# Patient Record
Sex: Female | Born: 1970 | Race: Black or African American | Hispanic: No | Marital: Married | Smoking: Current every day smoker
Health system: Southern US, Community
[De-identification: ages and names within clinical notes are randomized; demographics above are authoritative.]

## PROBLEM LIST (undated history)

## (undated) DIAGNOSIS — G35 Multiple sclerosis: Secondary | ICD-10-CM

## (undated) DIAGNOSIS — G35D Multiple sclerosis, unspecified: Secondary | ICD-10-CM

## (undated) HISTORY — PX: ABDOMINAL HYSTERECTOMY: SHX81

## (undated) HISTORY — PX: BREAST SURGERY: SHX581

## (undated) HISTORY — PX: BACK SURGERY: SHX140

---

## 2013-07-30 DIAGNOSIS — I10 Essential (primary) hypertension: Secondary | ICD-10-CM

## 2013-07-30 HISTORY — DX: Essential (primary) hypertension: I10

## 2013-12-04 DIAGNOSIS — F319 Bipolar disorder, unspecified: Secondary | ICD-10-CM

## 2013-12-04 HISTORY — DX: Bipolar disorder, unspecified: F31.9

## 2013-12-25 DIAGNOSIS — N951 Menopausal and female climacteric states: Secondary | ICD-10-CM

## 2013-12-25 HISTORY — DX: Menopausal and female climacteric states: N95.1

## 2014-08-26 DIAGNOSIS — R748 Abnormal levels of other serum enzymes: Secondary | ICD-10-CM

## 2014-08-26 HISTORY — DX: Abnormal levels of other serum enzymes: R74.8

## 2014-10-29 DIAGNOSIS — R9389 Abnormal findings on diagnostic imaging of other specified body structures: Secondary | ICD-10-CM | POA: Insufficient documentation

## 2014-10-29 HISTORY — DX: Abnormal findings on diagnostic imaging of other specified body structures: R93.89

## 2014-12-10 DIAGNOSIS — R531 Weakness: Secondary | ICD-10-CM | POA: Insufficient documentation

## 2014-12-10 HISTORY — DX: Weakness: R53.1

## 2014-12-19 DIAGNOSIS — G379 Demyelinating disease of central nervous system, unspecified: Secondary | ICD-10-CM

## 2014-12-19 DIAGNOSIS — N3941 Urge incontinence: Secondary | ICD-10-CM

## 2014-12-19 HISTORY — DX: Urge incontinence: N39.41

## 2014-12-19 HISTORY — DX: Demyelinating disease of central nervous system, unspecified: G37.9

## 2015-01-11 DIAGNOSIS — G35A Relapsing-remitting multiple sclerosis: Secondary | ICD-10-CM

## 2015-01-11 DIAGNOSIS — G35 Multiple sclerosis: Secondary | ICD-10-CM

## 2015-01-11 HISTORY — DX: Multiple sclerosis: G35

## 2015-01-11 HISTORY — DX: Relapsing-remitting multiple sclerosis: G35.A

## 2015-03-05 DIAGNOSIS — H524 Presbyopia: Secondary | ICD-10-CM

## 2015-03-05 HISTORY — DX: Presbyopia: H52.4

## 2018-07-07 DIAGNOSIS — R7303 Prediabetes: Secondary | ICD-10-CM

## 2018-07-07 HISTORY — DX: Prediabetes: R73.03

## 2018-10-07 ENCOUNTER — Other Ambulatory Visit (HOSPITAL_BASED_OUTPATIENT_CLINIC_OR_DEPARTMENT_OTHER): Payer: Self-pay | Admitting: Family Medicine

## 2018-10-07 DIAGNOSIS — M79661 Pain in right lower leg: Secondary | ICD-10-CM

## 2018-10-09 ENCOUNTER — Ambulatory Visit (HOSPITAL_BASED_OUTPATIENT_CLINIC_OR_DEPARTMENT_OTHER)
Admission: RE | Admit: 2018-10-09 | Discharge: 2018-10-09 | Disposition: A | Discharge status: Home / Self Care | Payer: Medicare Other | Source: Ambulatory Visit | Attending: Family Medicine | Admitting: Family Medicine

## 2018-10-09 DIAGNOSIS — M7121 Synovial cyst of popliteal space [Baker], right knee: Secondary | ICD-10-CM | POA: Insufficient documentation

## 2018-10-09 DIAGNOSIS — M79661 Pain in right lower leg: Secondary | ICD-10-CM | POA: Diagnosis present

## 2018-12-19 ENCOUNTER — Other Ambulatory Visit: Payer: Self-pay | Admitting: Obstetrics and Gynecology

## 2018-12-19 DIAGNOSIS — Z1231 Encounter for screening mammogram for malignant neoplasm of breast: Secondary | ICD-10-CM

## 2019-01-03 MED ORDER — GENERIC EXTERNAL MEDICATION
Status: DC
Start: 2019-01-04 — End: 2019-01-03

## 2019-01-03 MED ORDER — GENERIC EXTERNAL MEDICATION
150.00 | Status: DC
Start: 2019-01-04 — End: 2019-01-03

## 2019-02-06 DIAGNOSIS — G4733 Obstructive sleep apnea (adult) (pediatric): Secondary | ICD-10-CM

## 2019-02-06 HISTORY — DX: Obstructive sleep apnea (adult) (pediatric): G47.33

## 2019-03-31 DIAGNOSIS — K219 Gastro-esophageal reflux disease without esophagitis: Secondary | ICD-10-CM

## 2019-03-31 DIAGNOSIS — G894 Chronic pain syndrome: Secondary | ICD-10-CM

## 2019-03-31 DIAGNOSIS — F339 Major depressive disorder, recurrent, unspecified: Secondary | ICD-10-CM

## 2019-03-31 HISTORY — DX: Chronic pain syndrome: G89.4

## 2019-03-31 HISTORY — DX: Morbid (severe) obesity due to excess calories: E66.01

## 2019-03-31 HISTORY — DX: Gastro-esophageal reflux disease without esophagitis: K21.9

## 2019-03-31 HISTORY — DX: Major depressive disorder, recurrent, unspecified: F33.9

## 2019-04-06 IMAGING — US US EXTREM LOW VENOUS*R*
1 series · 14 of 24 positions shown · non-contrast
Comparison: None.

CLINICAL DATA: Right calf pain and swelling

EXAM:
RIGHT LOWER EXTREMITY VENOUS DUPLEX ULTRASOUND
TECHNIQUE: Doppler venous assessment of the right lower extremity deep venous
system was performed, including characterization of spectral flow,
compressibility, and phasicity.

[Series 1: us extrem low venous*right* · 0.10mm/px · 14 of 40 slices shown]
[im 1/40]
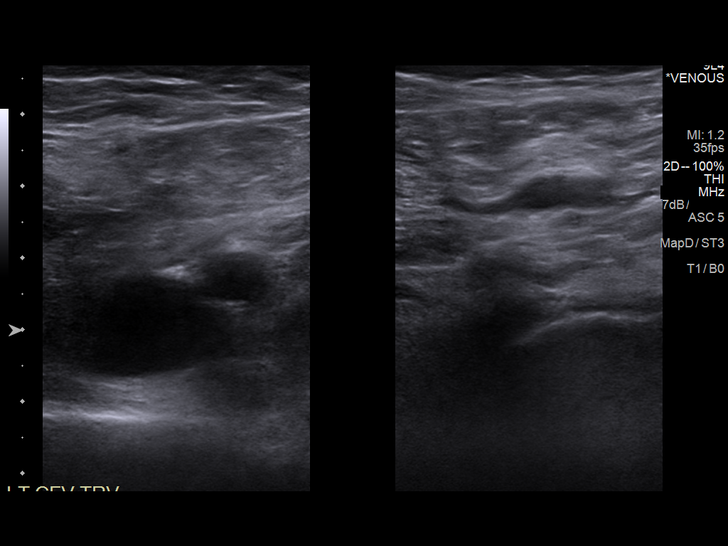
[im 4/40]
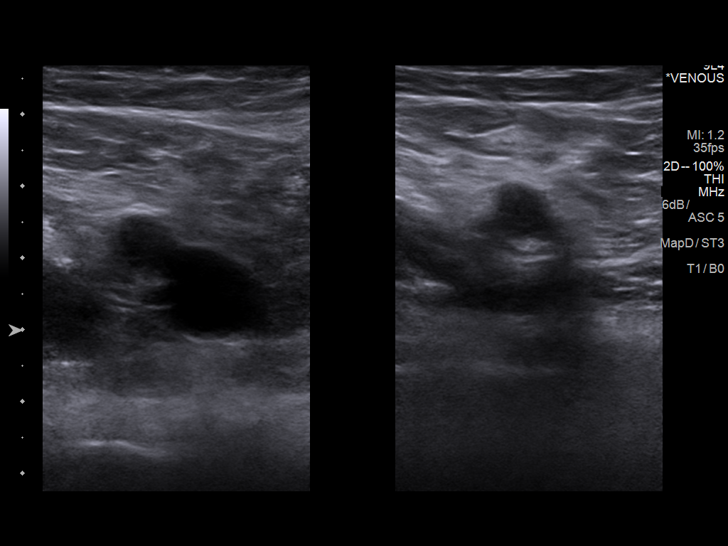
[im 7/40]
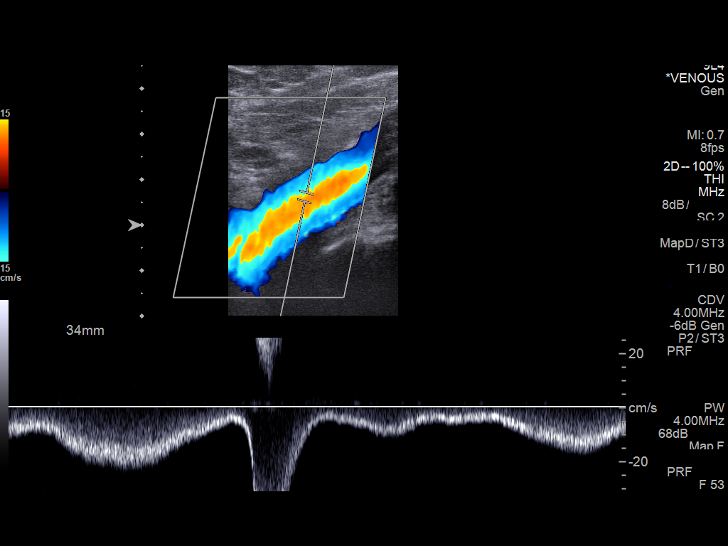
[im 11/40]
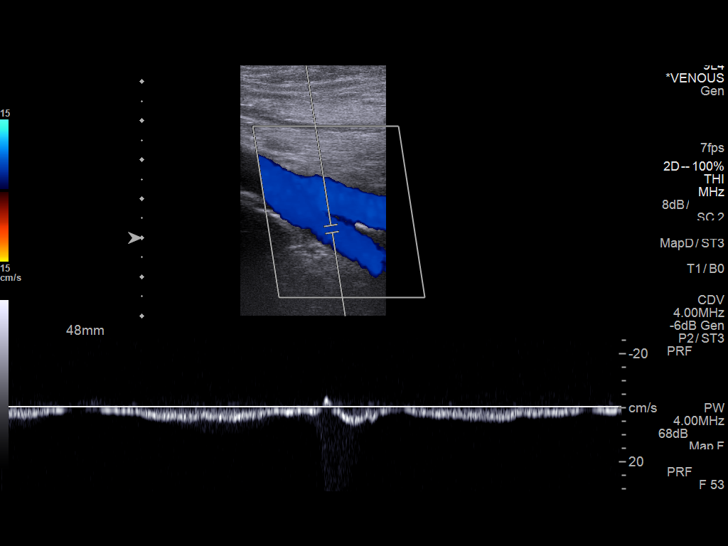
[im 12/40]
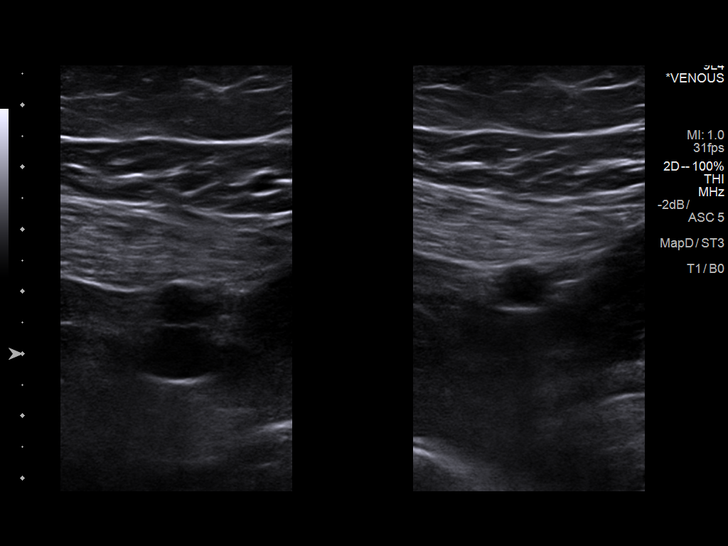
[im 16/40]
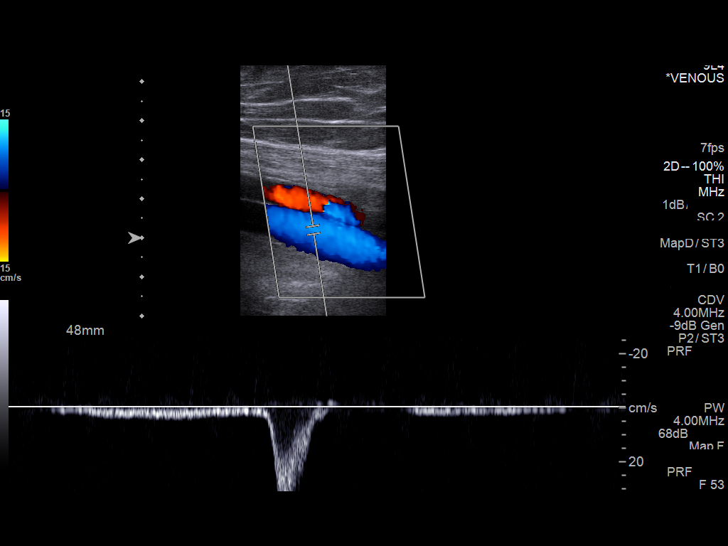
[im 19/40]
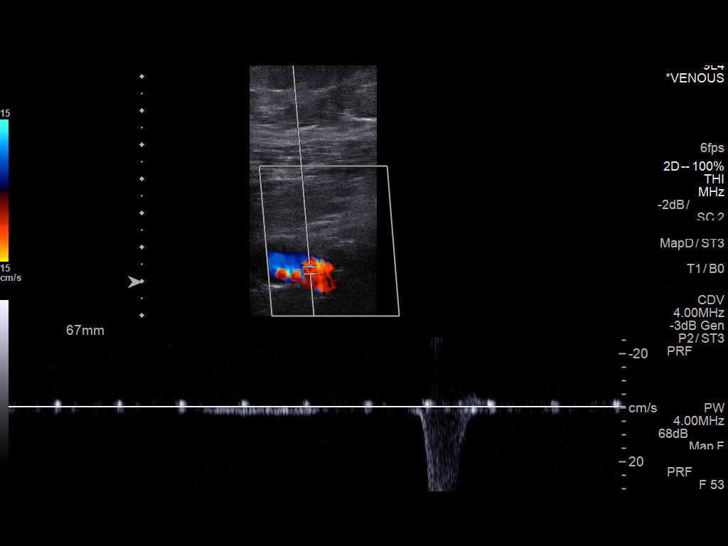
[im 21/40]
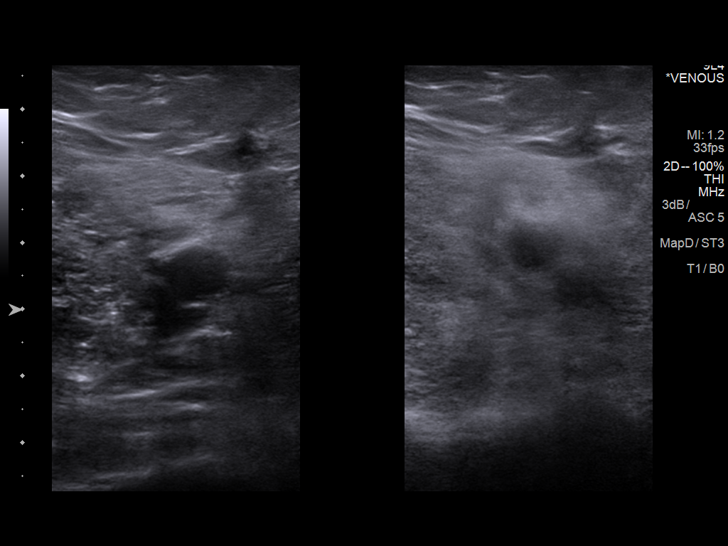
[im 24/40]
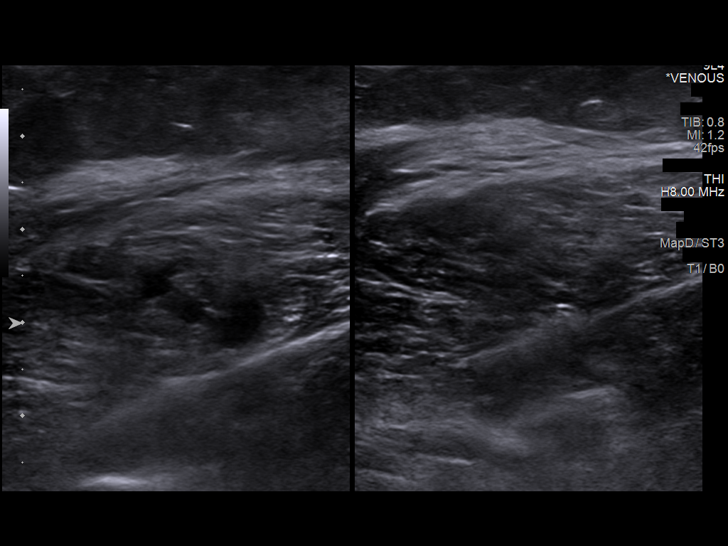
[im 28/40]
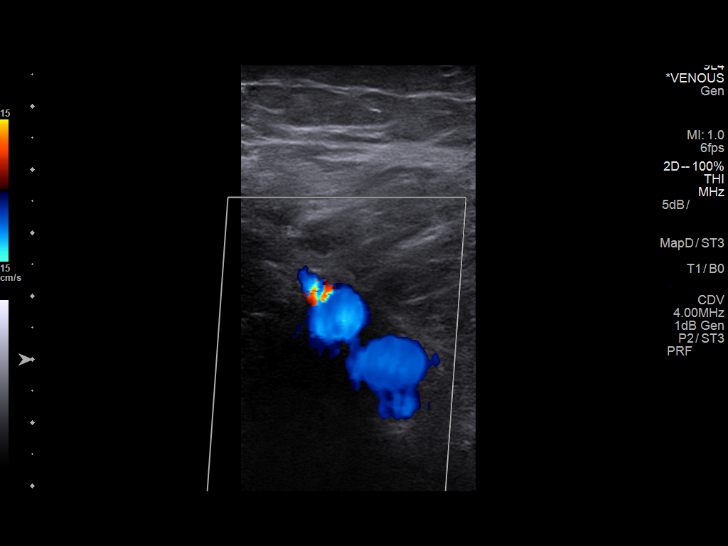
[im 31/40]
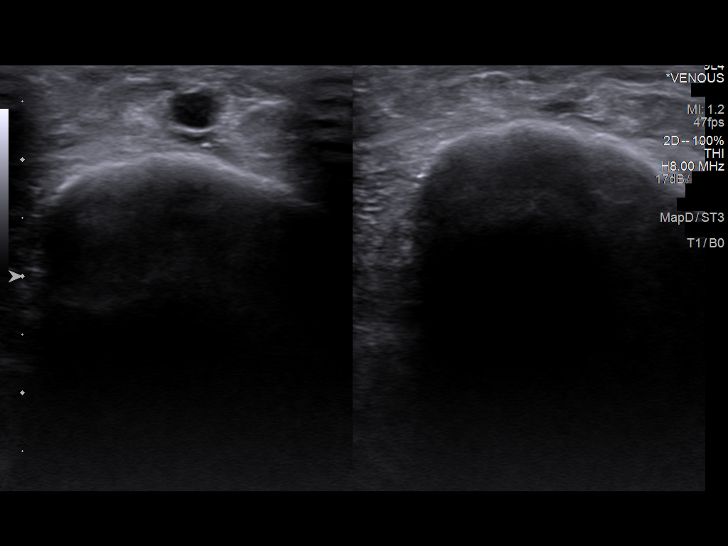
[im 33/40]
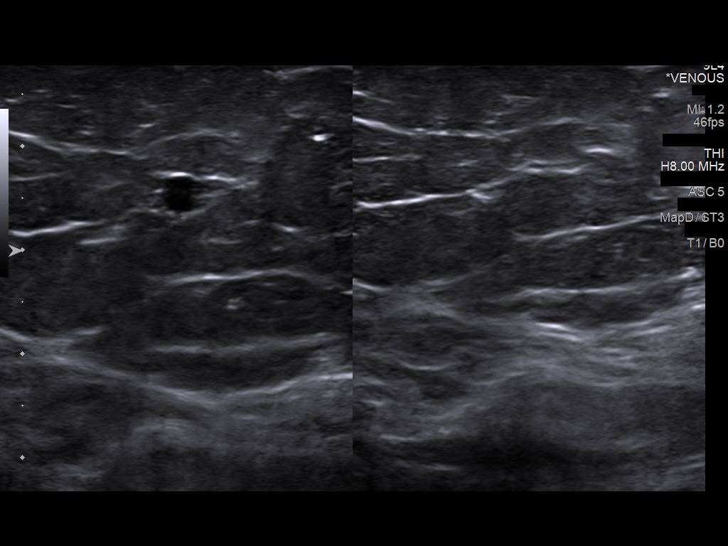
[im 36/40]
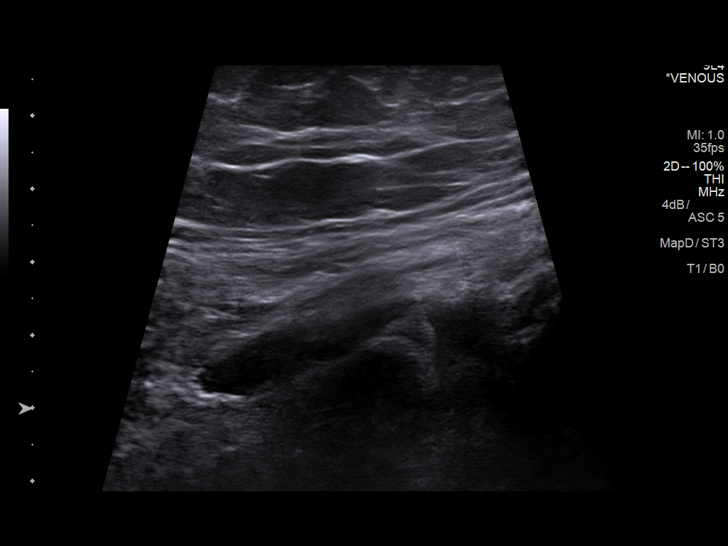
[im 40/40]
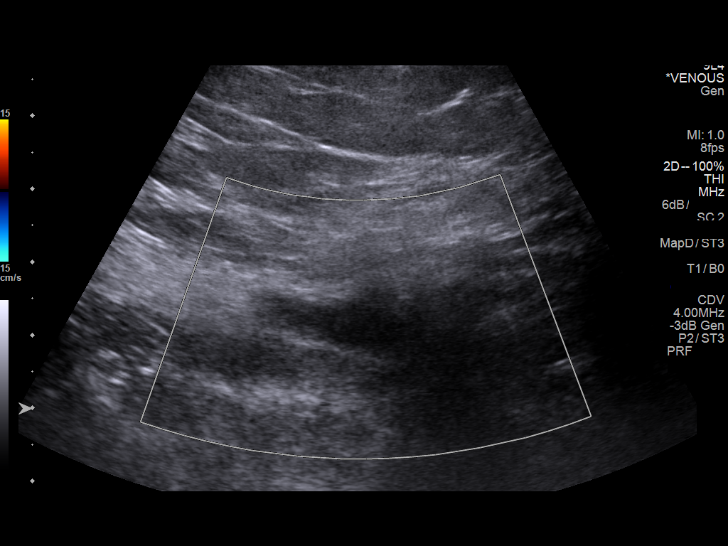

[14 of 24 positions shown; findings below may reference images not displayed]

FINDINGS: There is complete compressibility of the right common femoral,
femoral, and popliteal veins. Doppler analysis demonstrates
respiratory phasicity and augmentation of flow with calf
compression. No obvious superficial vein or calf vein thrombosis.
There is a 4.1 x 0.8 x 2.5 cm complex fluid collection in the right
popliteal fossa compatible with a small Baker's cyst.
IMPRESSION: No evidence of right lower extremity DVT.

Small Baker's cyst in the right popliteal fossa.

## 2019-04-28 ENCOUNTER — Other Ambulatory Visit: Payer: Self-pay | Admitting: Family Medicine

## 2019-04-28 DIAGNOSIS — Z1231 Encounter for screening mammogram for malignant neoplasm of breast: Secondary | ICD-10-CM

## 2019-12-12 DIAGNOSIS — M47816 Spondylosis without myelopathy or radiculopathy, lumbar region: Secondary | ICD-10-CM

## 2019-12-12 HISTORY — DX: Spondylosis without myelopathy or radiculopathy, lumbar region: M47.816

## 2020-03-16 DIAGNOSIS — H8110 Benign paroxysmal vertigo, unspecified ear: Secondary | ICD-10-CM

## 2020-03-16 DIAGNOSIS — F5104 Psychophysiologic insomnia: Secondary | ICD-10-CM | POA: Insufficient documentation

## 2020-03-16 DIAGNOSIS — J3089 Other allergic rhinitis: Secondary | ICD-10-CM

## 2020-03-16 DIAGNOSIS — F411 Generalized anxiety disorder: Secondary | ICD-10-CM

## 2020-03-16 DIAGNOSIS — J302 Other seasonal allergic rhinitis: Secondary | ICD-10-CM

## 2020-03-16 HISTORY — DX: Benign paroxysmal vertigo, unspecified ear: H81.10

## 2020-03-16 HISTORY — DX: Other allergic rhinitis: J30.2

## 2020-03-16 HISTORY — DX: Other seasonal allergic rhinitis: J30.89

## 2020-03-16 HISTORY — DX: Generalized anxiety disorder: F41.1

## 2020-04-02 DIAGNOSIS — Z9884 Bariatric surgery status: Secondary | ICD-10-CM

## 2020-04-02 HISTORY — DX: Bariatric surgery status: Z98.84

## 2020-09-03 ENCOUNTER — Other Ambulatory Visit: Payer: Self-pay | Admitting: Family Medicine

## 2020-09-03 DIAGNOSIS — Z1231 Encounter for screening mammogram for malignant neoplasm of breast: Secondary | ICD-10-CM

## 2020-10-13 ENCOUNTER — Ambulatory Visit
Admission: RE | Admit: 2020-10-13 | Discharge: 2020-10-13 | Disposition: A | Discharge status: Home / Self Care | Payer: Medicare Other | Source: Ambulatory Visit | Attending: Family Medicine | Admitting: Family Medicine

## 2020-10-13 ENCOUNTER — Other Ambulatory Visit: Payer: Self-pay

## 2020-10-13 DIAGNOSIS — Z1231 Encounter for screening mammogram for malignant neoplasm of breast: Secondary | ICD-10-CM

## 2020-10-27 DIAGNOSIS — F419 Anxiety disorder, unspecified: Secondary | ICD-10-CM

## 2020-10-27 DIAGNOSIS — I10 Essential (primary) hypertension: Secondary | ICD-10-CM

## 2020-10-27 HISTORY — DX: Anxiety disorder, unspecified: F41.9

## 2020-10-27 HISTORY — DX: Essential (primary) hypertension: I10

## 2021-01-17 ENCOUNTER — Emergency Department (HOSPITAL_BASED_OUTPATIENT_CLINIC_OR_DEPARTMENT_OTHER)
Admission: EM | Admit: 2021-01-17 | Discharge: 2021-01-17 | Disposition: A | Discharge status: Home / Self Care | Payer: 59 | Attending: Emergency Medicine | Admitting: Emergency Medicine

## 2021-01-17 ENCOUNTER — Other Ambulatory Visit: Payer: Self-pay

## 2021-01-17 ENCOUNTER — Emergency Department (HOSPITAL_BASED_OUTPATIENT_CLINIC_OR_DEPARTMENT_OTHER): Payer: 59

## 2021-01-17 ENCOUNTER — Encounter (HOSPITAL_BASED_OUTPATIENT_CLINIC_OR_DEPARTMENT_OTHER): Payer: Self-pay

## 2021-01-17 DIAGNOSIS — R001 Bradycardia, unspecified: Secondary | ICD-10-CM | POA: Diagnosis not present

## 2021-01-17 DIAGNOSIS — I951 Orthostatic hypotension: Secondary | ICD-10-CM | POA: Diagnosis not present

## 2021-01-17 DIAGNOSIS — R059 Cough, unspecified: Secondary | ICD-10-CM | POA: Diagnosis not present

## 2021-01-17 DIAGNOSIS — Z91040 Latex allergy status: Secondary | ICD-10-CM | POA: Diagnosis not present

## 2021-01-17 DIAGNOSIS — R0602 Shortness of breath: Secondary | ICD-10-CM | POA: Insufficient documentation

## 2021-01-17 DIAGNOSIS — F17290 Nicotine dependence, other tobacco product, uncomplicated: Secondary | ICD-10-CM | POA: Insufficient documentation

## 2021-01-17 DIAGNOSIS — R42 Dizziness and giddiness: Secondary | ICD-10-CM | POA: Diagnosis present

## 2021-01-17 HISTORY — DX: Multiple sclerosis, unspecified: G35.D

## 2021-01-17 HISTORY — DX: Multiple sclerosis: G35

## 2021-01-17 LAB — COMPREHENSIVE METABOLIC PANEL
ALT: 14 U/L (ref 0–44)
AST: 14 U/L — ABNORMAL LOW (ref 15–41)
Albumin: 3.2 g/dL — ABNORMAL LOW (ref 3.5–5.0)
Alkaline Phosphatase: 92 U/L (ref 38–126)
Anion gap: 10 (ref 5–15)
BUN: 10 mg/dL (ref 6–20)
CO2: 21 mmol/L — ABNORMAL LOW (ref 22–32)
Calcium: 8.7 mg/dL — ABNORMAL LOW (ref 8.9–10.3)
Chloride: 109 mmol/L (ref 98–111)
Creatinine, Ser: 1.02 mg/dL — ABNORMAL HIGH (ref 0.44–1.00)
GFR, Estimated: >60 mL/min (ref >60)
Glucose, Bld: 100 mg/dL — ABNORMAL HIGH (ref 70–99)
Potassium: 3.2 mmol/L — ABNORMAL LOW (ref 3.5–5.1)
Sodium: 140 mmol/L (ref 135–145)
Total Bilirubin: 0.3 mg/dL (ref 0.3–1.2)
Total Protein: 5.6 g/dL — ABNORMAL LOW (ref 6.5–8.1)

## 2021-01-17 LAB — CBC WITH DIFFERENTIAL/PLATELET
Abs Immature Granulocytes: 0.04 10*3/uL (ref 0.00–0.07)
Basophils Absolute: 0.1 10*3/uL (ref 0.0–0.1)
Basophils Relative: 1 %
Eosinophils Absolute: 0.2 10*3/uL (ref 0.0–0.5)
Eosinophils Relative: 1 %
HCT: 43.1 % (ref 36.0–46.0)
Hemoglobin: 15 g/dL (ref 12.0–15.0)
Immature Granulocytes: 0 %
Lymphocytes Relative: 26 %
Lymphs Abs: 3.8 10*3/uL (ref 0.7–4.0)
MCH: 29.2 pg (ref 26.0–34.0)
MCHC: 34.8 g/dL (ref 30.0–36.0)
MCV: 83.9 fL (ref 80.0–100.0)
Monocytes Absolute: 1 10*3/uL (ref 0.1–1.0)
Monocytes Relative: 7 %
Neutro Abs: 9.9 10*3/uL — ABNORMAL HIGH (ref 1.7–7.7)
Neutrophils Relative %: 65 %
Platelets: 375 10*3/uL (ref 150–400)
RBC: 5.14 MIL/uL — ABNORMAL HIGH (ref 3.87–5.11)
RDW: 14.5 % (ref 11.5–15.5)
WBC: 15 10*3/uL — ABNORMAL HIGH (ref 4.0–10.5)
nRBC: 0 % (ref 0.0–0.2)

## 2021-01-17 MED ORDER — MECLIZINE HCL 25 MG PO TABS
25.0000 mg | ORAL_TABLET | Freq: Once | ORAL | Status: AC
  Administered 2021-01-17: 25 mg via ORAL
  Filled 2021-01-17: qty 1

## 2021-01-17 MED ORDER — SODIUM CHLORIDE 0.9 % IV BOLUS
1000.0000 mL | Freq: Once | INTRAVENOUS | Status: AC
  Administered 2021-01-17: 1000 mL via INTRAVENOUS

## 2021-01-17 NOTE — ED Triage Notes (Signed)
Pt arrives with c/o SOB and dizziness has been seen at [facility] and placed on antibiotic and inhalers and steroids. SOB since Wednesday, dizziness since Saturday. Pt does have MS.

## 2021-01-17 NOTE — ED Notes (Signed)
States that she was seen at the [facility] for bronchitis   Last week given 2 inhalers and antio biotics and steroids , filled last wed but she is still sob and coughing up thick sputum

## 2021-01-17 NOTE — ED Notes (Signed)
Pt up to [facility] with assist of tech and steady

## 2021-01-17 NOTE — Discharge Instructions (Signed)
Follow up with your MS doctor to discuss your orthostatic hypotension. Referral to cardiology as discussed due to bradycardia.  Return to the ER for worsening or concerning symptoms.

## 2021-01-17 NOTE — ED Provider Notes (Signed)
[facility] EMERGENCY DEPARTMENT Provider Note   CSN: [id number] Arrival date & time: 01/17/21  1324     History Chief Complaint  Patient presents with  . Shortness of Breath  . Dizziness    Deanna Archer is a 50 y.o. female.  50 year old female with past medical history of MS presents with complaint of feeling lightheaded with ongoing productive cough and generalized weakness.  Patient states that she has had a productive cough with thick green mucus since November.  Patient was seen in urgent care 1 week ago, thought to possibly have pneumonia and was given doxycycline, prednisone and inhaler.  Chest x-ray at that time was normal, antibiotics and steroids did not improve her cough or symptoms.  Patient states that she has been having dizziness/lightheadedness since December, was started on meclizine by her PCP in January and states that she takes this daily.  Patient ran out of her medication about a week ago, states that her lightheadedness returned 2 days ago, is worse with standing.  Patient reports her normal resting heart rate is between 50 and 70, states well in the emergency room on the monitor her heart rate is averaging in the low 50s.  Patient called her PCP today to request refill of her meclizine and was directed towards the emergency room.  She denies unilateral weakness or numbness, changes in vision, speech, gait.  No other complaints or concerns today.        Past Medical History:  Diagnosis Date  . MS (multiple sclerosis) (HCC)     There are no problems to display for this patient.   Past Surgical History:  Procedure Laterality Date  . ABDOMINAL HYSTERECTOMY    . BACK SURGERY    . BREAST SURGERY       OB History   No obstetric history on file.     No family history on file.  Social History   Tobacco Use  . Smoking status: Current Every Day Smoker    Types: Cigars  . Smokeless tobacco: Never Used  Substance Use Topics  . Alcohol use:  Never  . Drug use: Never    Home Medications Prior to Admission medications   Medication Sig Start Date End Date Taking? Authorizing Provider  albuterol (VENTOLIN HFA) 108 (90 Base) MCG/ACT inhaler Inhale 2 puffs into the lungs every 6 (six) hours as needed. 09/30/20  Yes [provider]  amantadine (SYMMETREL) 100 MG capsule Take by mouth. 01/25/17  Yes [provider]  amitriptyline (ELAVIL) 10 MG tablet Take by mouth. 11/18/20  Yes [provider]  amphetamine-dextroamphetamine (ADDERALL) 15 MG tablet Take 1 tablet by mouth daily. 01/10/21  Yes [provider]  busPIRone (BUSPAR) 15 MG tablet Take by mouth. 04/01/18 02/16/21 Yes [provider]  fluticasone (FLOVENT HFA) 110 MCG/ACT inhaler Inhale into the lungs. 11/10/20 11/10/21 Yes [provider]  furosemide (LASIX) 20 MG tablet Take by mouth. 11/01/18  Yes [provider]  mirtazapine (REMERON) 45 MG tablet Take by mouth. 01/09/18  Yes [provider]  oxyCODONE ER (XTAMPZA ER) 27 MG C12A TAKE 1 CAPSULE BY MOUTH EVERY 12 HOURS WITH FOOD 01/07/21  Yes [provider]  pantoprazole (PROTONIX) 40 MG tablet Take 1 tablet (40mg ) twice daily for 2 weeks, then take 1 tablet (40mg ) daily 08/16/20  Yes [provider]  risperiDONE (RISPERDAL) 2 MG tablet Take by mouth. 11/18/20 02/16/21 Yes [provider]  tiZANidine (ZANAFLEX) 4 MG tablet Take by mouth. 04/29/19  Yes [provider]  traZODone (DESYREL) 100 MG tablet Half to one pill po qsh prn for sleep. 04/01/18  Yes [provider]  Vitamin D, Ergocalciferol, (DRISDOL) 1.25 MG (50000 UNIT) CAPS capsule Take by mouth. 03/12/15  Yes [provider]  doxycycline (VIBRAMYCIN) 100 MG capsule Take by mouth. 01/10/21 01/17/21  [provider]  methylPREDNISolone (MEDROL DOSEPAK) 4 MG TBPK tablet See admin instructions. 01/10/21 01/17/21  [provider]    Allergies     Latex  Review of Systems   Review of Systems  Constitutional: Negative for chills and fever.  HENT: Negative for congestion.   Eyes: Negative for visual disturbance.  Respiratory: Positive for cough. Negative for shortness of breath.   Cardiovascular: Negative for chest pain.  Gastrointestinal: Negative for abdominal pain, constipation, diarrhea, nausea and vomiting.  Genitourinary: Negative for dysuria.  Musculoskeletal: Negative for arthralgias and myalgias.  Skin: Negative for rash and wound.  Allergic/Immunologic: Positive for immunocompromised state.  Neurological: Positive for weakness and light-headedness. Negative for speech difficulty.  All other systems reviewed and are negative.   Physical Exam Updated Vital Signs BP (!) 136/95   Pulse (!) 46   Temp 98.4 F (36.9 C) (Oral)   Resp 17   Ht 5\' 8"  (1.727 m)   Wt 74.8 kg   SpO2 100%   BMI 25.09 kg/m   Physical Exam Vitals and nursing note reviewed.  Constitutional:      General: She is not in acute distress.    Appearance: She is well-developed. She is not diaphoretic.  HENT:     Head: Normocephalic and atraumatic.  Cardiovascular:     Rate and Rhythm: Regular rhythm. Bradycardia present.     Heart sounds: No murmur heard.   Pulmonary:     Breath sounds: Normal breath sounds.  Chest:     Chest wall: No tenderness.  Abdominal:     Palpations: Abdomen is soft.     Tenderness: There is no abdominal tenderness.  Musculoskeletal:     Right lower leg: No edema.     Left lower leg: No edema.  Skin:    General: Skin is warm and dry.     Findings: No erythema or rash.  Neurological:     Mental Status: She is alert and oriented to person, place, and time.  Psychiatric:        Behavior: Behavior normal.     ED Results / Procedures / Treatments   Labs (all labs ordered are listed, but only abnormal results are displayed) Labs Reviewed  COMPREHENSIVE METABOLIC PANEL - Abnormal; Notable for the following  components:      Result Value   Potassium 3.2 (*)    CO2 21 (*)    Glucose, Bld 100 (*)    Creatinine, Ser 1.02 (*)    Calcium 8.7 (*)    Total Protein 5.6 (*)    Albumin 3.2 (*)    AST 14 (*)    All other components within normal limits  CBC WITH DIFFERENTIAL/PLATELET - Abnormal; Notable for the following components:   WBC 15.0 (*)    RBC 5.14 (*)    Neutro Abs 9.9 (*)    All other components within normal limits    EKG EKG Interpretation  Date/Time:  Monday January 17 2021 13:57:58 EDT Ventricular Rate:  71 PR Interval:  180 QRS Duration: 82 QT Interval:  416 QTC Calculation: 452 R Axis:   85 Text Interpretation: Normal sinus rhythm Minimal voltage criteria  for LVH, may be normal variant ( Sokolow-Lyon ) Nonspecific T wave abnormality Abnormal ECG No STEMI Confirmed by Alvester Chou [id number]) on 01/17/2021 2:16:37 PM   Radiology DG Chest 2 View  Result Date: 01/17/2021 CLINICAL DATA:  Cough.  Shortness of breath. EXAM: CHEST - 2 VIEW COMPARISON:  January 10, 2021. FINDINGS: The heart size and mediastinal contours are within normal limits. Both lungs are clear. No pneumothorax or pleural effusion is noted. The visualized skeletal structures are unremarkable. IMPRESSION: No active cardiopulmonary disease. Electronically Signed   By: Lupita Raider M.D.   On: 01/17/2021 15:13    Procedures Procedures   Medications Ordered in ED Medications  meclizine (ANTIVERT) tablet 25 mg (25 mg Oral Given 01/17/21 1603)  sodium chloride 0.9 % bolus 1,000 mL (0 mLs Intravenous Stopped 01/17/21 1926)    ED Course  I have reviewed the triage vital signs and the nursing notes.  Pertinent labs & imaging results that were available during my care of the patient were reviewed by me and considered in my medical decision making (see chart for details).  Clinical Course as of 01/17/21 2225  Mon Jan 17, 2021  [id number] 50 year old female with MS with complaint of dizziness/lightheadedness with  standing. Onset 3 months ago, has been on meclizine daily which seemed to be helping until she ran out a week ago. On exam, well appearing. Found to be orthostatic, given IV fluids, remains orthostatic but feels better and would like to follow up with her provider.  CXR normal, no PNA to explain her cough x 4 months which did not improve with doxycycline and prednisone from UC last week. CBC with leukocytosis, suspect secondary to recent steroids.  CMP with mild hypokalemia, K 3.2.  Plan is to discharge to follow up with her MS specialist to discuss her orthostatic symptoms/vitals, given referral to cardiology as requested for her bradycardia (low 50s in the ER, not on a betablocker). [LM]    Clinical Course User Index [LM] Alden Hipp   MDM Rules/Calculators/A&P                          Final Clinical Impression(s) / ED Diagnoses Final diagnoses:  Orthostatic hypotension  Bradycardia    Rx / DC Orders ED Discharge Orders    None       Alden Hipp 01/17/21 2225    Pollyann Savoy, MD 01/17/21 2308

## 2021-01-31 DIAGNOSIS — Z79891 Long term (current) use of opiate analgesic: Secondary | ICD-10-CM

## 2021-01-31 HISTORY — DX: Long term (current) use of opiate analgesic: Z79.891

## 2021-02-01 ENCOUNTER — Other Ambulatory Visit: Payer: Self-pay

## 2021-02-04 ENCOUNTER — Other Ambulatory Visit: Payer: Self-pay

## 2021-02-04 ENCOUNTER — Ambulatory Visit (INDEPENDENT_AMBULATORY_CARE_PROVIDER_SITE_OTHER): Payer: 59 | Admitting: Cardiology

## 2021-02-04 ENCOUNTER — Ambulatory Visit (INDEPENDENT_AMBULATORY_CARE_PROVIDER_SITE_OTHER): Payer: 59

## 2021-02-04 ENCOUNTER — Encounter: Payer: Self-pay | Admitting: Cardiology

## 2021-02-04 ENCOUNTER — Encounter: Payer: Self-pay | Admitting: Emergency Medicine

## 2021-02-04 VITALS — BP 90/50 | HR 73 | Ht 68.0 in | Wt 168.0 lb

## 2021-02-04 DIAGNOSIS — G35 Multiple sclerosis: Secondary | ICD-10-CM

## 2021-02-04 DIAGNOSIS — I10 Essential (primary) hypertension: Secondary | ICD-10-CM

## 2021-02-04 DIAGNOSIS — R001 Bradycardia, unspecified: Secondary | ICD-10-CM | POA: Insufficient documentation

## 2021-02-04 DIAGNOSIS — R002 Palpitations: Secondary | ICD-10-CM

## 2021-02-04 DIAGNOSIS — I951 Orthostatic hypotension: Secondary | ICD-10-CM | POA: Diagnosis not present

## 2021-02-04 NOTE — Progress Notes (Signed)
Cardiology Consultation:    Date:  02/04/2021   ID:  Deanna Archer, DOB 1970/11/28, MRN [id number]  PCP:  Ethelda Chick, MD  Cardiologist:  Gypsy Balsam, MD   Referring MD: Ethelda Chick, MD   Chief Complaint  Patient presents with  . Low Heart Rate   . Dizziness    History of Present Illness:    Deanna Archer is a 50 y.o. female who is being seen today for the evaluation of dizziness at the request of Ethelda Chick, MD.  With past medical history significant for multiple sclerosis, essential hypertension, chronic pain syndrome, bipolar disorder, orthostatic hypotension, dizziness.  She was referred to [place] because of episode of dizziness she also noted her heart rate being slow what she is talking about his heart rate 50-60.  Eventually she ended up being in the emergency room in the winter of this year because of episodes of bronchitis but she was find to be significantly orthostatic.  She described to have dizziness dizziness typically happen when she stands for long period of time.  She never passed out but there was situation she had to sit down because of the sensation.  There is no palpitations no pounding in the chest.  She check her heart rate quite regularly see heart rate between 50 and 70.  She thinks it is too slow.  She also reported to have some swelling of lower extremities especially evening time.  She does have multiple pain required multiple medications for it.  Also bipolar disorder. She smokes about 2 cigarettes a day. She is trying to some do some exercises however lately she has a problem with sciatica and she cannot do much. She does have family history of premature coronary artery disease actually her mother just recently had a stent placed and she started having problem before she was 43.   Past Medical History:  Diagnosis Date  . Abnormal MRI 10/29/2014  . Anxiety 10/27/2020  . Benign essential hypertension 07/30/2013  . Benign recurrent vertigo  03/16/2020  . Bipolar 1 disorder (HCC) 12/04/2013  . Chronic pain syndrome 03/31/2019  . Demyelinating disease (HCC) 12/19/2014  . Elevated alkaline phosphatase level 08/26/2014  . Gastroesophageal reflux disease without esophagitis 03/31/2019  . Generalized anxiety disorder 03/16/2020  . Hypertension 10/27/2020  . Long term (current) use of opiate analgesic 01/31/2021  . Menopausal hot flushes 12/25/2013  . Morbid obesity due to excess calories (HCC) 03/31/2019  . MS (multiple sclerosis) (HCC)   . OSA (obstructive sleep apnea) 02/06/2019  . Perennial allergic rhinitis with seasonal variation 03/16/2020  . Prediabetes 07/07/2018  . Presbyopia of both eyes 03/05/2015  . Recurrent major depressive disorder (HCC) 03/31/2019  . Relapsing-remitting multiple sclerosis (HCC) 01/11/2015  . 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 Spondylosis of lumbar region without myelopathy or radiculopathy 12/12/2019   Formatting of this note might be different from the original. Added automatically from request for surgery [id number]  Formatting of this note might be different from the original. Added automatically from request for surgery [id number]  . Urge incontinence of urine 12/19/2014  . Weakness 12/10/2014    Past Surgical History:  Procedure Laterality Date  . ABDOMINAL HYSTERECTOMY    . BACK SURGERY    . BREAST SURGERY      Current Medications: Current Meds  Medication Sig  . albuterol (VENTOLIN HFA) 108 (90 Base) MCG/ACT inhaler Inhale 2 puffs into the lungs every 6 (six) hours as needed for wheezing or  shortness of breath.  . amphetamine-dextroamphetamine (ADDERALL) 15 MG tablet Take 1 tablet by mouth daily.  . benzonatate (TESSALON) 200 MG capsule Take 200 mg by mouth daily.  Marland Kitchen buPROPion (WELLBUTRIN XL) 150 MG 24 hr tablet Take 1 tablet by mouth daily.  . busPIRone (BUSPAR) 15 MG tablet Take 15 mg by mouth 2 (two) times daily.  Marland Kitchen estradiol (ESTRACE) 0.1 MG/GM vaginal cream Place 1 Applicatorful vaginally 2  (two) times a week.  . fluticasone (FLOVENT HFA) 110 MCG/ACT inhaler Inhale 1 puff into the lungs daily.  . furosemide (LASIX) 20 MG tablet Take 20 mg by mouth as needed for fluid.  Marland Kitchen HYDROcodone-acetaminophen (NORCO) 10-325 MG tablet Take 1 tablet by mouth every 6 (six) hours as needed for moderate pain.  Marland Kitchen linaclotide (LINZESS) 72 MCG capsule Take 72 mcg by mouth as needed for constipation.  . mirabegron ER (MYRBETRIQ) 25 MG TB24 tablet Take 25 mg by mouth daily.  . mirtazapine (REMERON) 45 MG tablet Take 45 mg by mouth at bedtime.  Marland Kitchen morphine (MSIR) 15 MG tablet Take 15 mg by mouth every 6 (six) hours as needed for severe pain or moderate pain.  . pantoprazole (PROTONIX) 40 MG tablet Take 40 mg by mouth daily.  . risperiDONE (RISPERDAL) 2 MG tablet Take 2 mg by mouth 2 (two) times daily.  Marland Kitchen tiZANidine (ZANAFLEX) 4 MG tablet Take 4 mg by mouth 3 (three) times daily.  . Vitamin D, Ergocalciferol, (DRISDOL) 1.25 MG (50000 UNIT) CAPS capsule Take 50,000 Units by mouth every 7 (seven) days.  . [DISCONTINUED] ketorolac (TORADOL) 10 MG tablet Take 1 tablet by mouth every 6 (six) hours as needed for pain.     Allergies:   Latex   Social History   Socioeconomic History  . Marital status: Married    Spouse name: Not on file  . Number of children: Not on file  . Years of education: Not on file  . Highest education level: Not on file  Occupational History  . Not on file  Tobacco Use  . Smoking status: Current Every Day Smoker    Types: Cigars  . Smokeless tobacco: Never Used  Substance and Sexual Activity  . Alcohol use: Never  . Drug use: Never  . Sexual activity: Not on file  Other Topics Concern  . Not on file  Social History Narrative  . Not on file   Social Determinants of Health   Financial Resource Strain: Not on file  Food Insecurity: Not on file  Transportation Needs: Not on file  Physical Activity: Not on file  Stress: Not on file  Social Connections: Not on file      Family History: The patient's family history includes Hypertension in her maternal grandfather and mother; Stroke in her maternal grandfather. ROS:   Please see the history of present illness.    All 14 point review of systems negative except as described per history of present illness.  EKGs/Labs/Other Studies Reviewed:    The following studies were reviewed today:   EKG:  EKG is  ordered today.  The ekg ordered today demonstrates normal sinus rhythm, normal P interval, normal QS complex duration morphology.  Recent Labs: 01/17/2021: ALT 14; BUN 10; Creatinine, Ser 1.02; Hemoglobin 15.0; Platelets 375; Potassium 3.2; Sodium 140  Recent Lipid Panel No results found for: CHOL, TRIG, HDL, CHOLHDL, VLDL, LDLCALC, LDLDIRECT  Physical Exam:    VS:  BP (!) 90/50 (BP Location: Right Arm, Patient Position: Sitting)   Pulse 73  Ht 5\' 8"  (1.727 m)   Wt 168 lb (76.2 kg)   SpO2 93%   BMI 25.54 kg/m     Wt Readings from Last 3 Encounters:  02/04/21 168 lb (76.2 kg)  01/17/21 165 lb (74.8 kg)     GEN:  Well nourished, well developed in no acute distress HEENT: Normal NECK: No JVD; No carotid bruits LYMPHATICS: No lymphadenopathy CARDIAC: RRR, no murmurs, no rubs, no gallops RESPIRATORY:  Clear to auscultation without rales, wheezing or rhonchi  ABDOMEN: Soft, non-tender, non-distended MUSCULOSKELETAL:  No edema; No deformity  SKIN: Warm and dry NEUROLOGIC:  Alert and oriented x 3 PSYCHIATRIC:  Normal affect   ASSESSMENT:    1. Benign essential hypertension   2. Sinus bradycardia   3. Orthostatic hypotension   4. Multiple sclerosis (HCC)    PLAN:    In order of problems listed above:  1. Benign essential hypertension actually blood pressure is low today which is concerning. 2. History of sinus bradycardia we will put monitor her to see exactly how much bradycardia we have for the mechanism and if this is something significant. 3. Orthostatic hypotension we did talk  about options for the situation meaning fluid status liberation of salt as well as elastic stockings.  Also told her that we do have some medications for this condition however before which for this medication we need to try to use conservative approach. 4. Cholesterol status unknown I will check her fasting lipid profile today. 5. Smoking obviously concerning.  I spent at least 5 minutes to talk to her about need to quit she understands she will try to do it. 6. I do not see any evidence of coronary artery disease in her therefore we will not do any evaluation for it right now.   Medication Adjustments/Labs and Tests Ordered: Current medicines are reviewed at length with the patient today.  Concerns regarding medicines are outlined above.  No orders of the defined types were placed in this encounter.  No orders of the defined types were placed in this encounter.   Signed, 01/19/21, MD, [facility]. 02/04/2021 9:47 AM    [facility]

## 2021-02-04 NOTE — Addendum Note (Signed)
Addended by: Hazle Quant on: 02/04/2021 09:58 AM   Modules accepted: Orders

## 2021-02-04 NOTE — Patient Instructions (Signed)
Medication Instructions:  Your physician recommends that you continue on your current medications as directed. Please refer to the Current Medication list given to you today.  *If you need a refill on your cardiac medications before your next appointment, please call your pharmacy*   Lab Work: Your physician recommends that you return for lab work today: lipid If you have labs (blood work) drawn today and your tests are completely normal, you will receive your results only by: Marland Kitchen MyChart Message (if you have MyChart) OR . A paper copy in the mail If you have any lab test that is abnormal or we need to change your treatment, we will call you to review the results.   Testing/Procedures: A zio monitor was ordered today. It will remain on for 7 days. You will then return monitor and event diary in provided box. It takes 1-2 weeks for report to be downloaded and returned to [place]. We will call you with the results. If monitor falls off or has orange flashing light, please call Zio for further instructions.   Your physician has requested that you have an echocardiogram. Echocardiography is a painless test that uses sound waves to create images of your heart. It provides your doctor with information about the size and shape of your heart and how well your heart's chambers and valves are working. This procedure takes approximately one hour. There are no restrictions for this procedure.     Follow-Up: At [facility], you and your health needs are our priority.  As part of our continuing mission to provide you with exceptional heart care, we have created designated Provider Care Teams.  These Care Teams include your primary Cardiologist (physician) and Advanced Practice Providers (APPs -  Physician Assistants and Nurse Practitioners) who all work together to provide you with the care you need, when you need it.  We recommend signing up for the patient portal called "MyChart".  Sign up information is  provided on this After Visit Summary.  MyChart is used to connect with patients for Virtual Visits (Telemedicine).  Patients are able to view lab/test results, encounter notes, upcoming appointments, etc.  Non-urgent messages can be sent to your provider as well.   To learn more about what you can do with MyChart, go to [email or web address].    Your next appointment:   6 week(s)  The format for your next appointment:   In Person  Provider:   Gypsy Balsam, MD   Other Instructions   Echocardiogram An echocardiogram is a test that uses sound waves (ultrasound) to produce images of the heart. Images from an echocardiogram can provide important information about:  Heart size and shape.  The size and thickness and movement of your heart's walls.  Heart muscle function and strength.  Heart valve function or if you have stenosis. Stenosis is when the heart valves are too narrow.  If blood is flowing backward through the heart valves (regurgitation).  A tumor or infectious growth around the heart valves.  Areas of heart muscle that are not working well because of poor blood flow or injury from a heart attack.  Aneurysm detection. An aneurysm is a weak or damaged part of an artery wall. The wall bulges out from the normal force of blood pumping through the body. Tell a health care provider about:  Any allergies you have.  All medicines you are taking, including vitamins, herbs, eye drops, creams, and over-the-counter medicines.  Any blood disorders you have.  Any surgeries you have  had.  Any medical conditions you have.  Whether you are pregnant or may be pregnant. What are the risks? Generally, this is a safe test. However, problems may occur, including an allergic reaction to dye (contrast) that may be used during the test. What happens before the test? No specific preparation is needed. You may eat and drink normally. What happens during the test?  You will take  off your clothes from the waist up and put on a hospital gown.  Electrodes or electrocardiogram (ECG)patches may be placed on your chest. The electrodes or patches are then connected to a device that monitors your heart rate and rhythm.  You will lie down on a table for an ultrasound exam. A gel will be applied to your chest to help sound waves pass through your skin.  A handheld device, called a transducer, will be pressed against your chest and moved over your heart. The transducer produces sound waves that travel to your heart and bounce back (or "echo" back) to the transducer. These sound waves will be captured in real-time and changed into images of your heart that can be viewed on a video monitor. The images will be recorded on a computer and reviewed by your health care provider.  You may be asked to change positions or hold your breath for a short time. This makes it easier to get different views or better views of your heart.  In some cases, you may receive contrast through an IV in one of your veins. This can improve the quality of the pictures from your heart. The procedure may vary among health care providers and hospitals.   What can I expect after the test? You may return to your normal, everyday life, including diet, activities, and medicines, unless your health care provider tells you not to do that. Follow these instructions at home:  It is up to you to get the results of your test. Ask your health care provider, or the department that is doing the test, when your results will be ready.  Keep all follow-up visits. This is important. Summary  An echocardiogram is a test that uses sound waves (ultrasound) to produce images of the heart.  Images from an echocardiogram can provide important information about the size and shape of your heart, heart muscle function, heart valve function, and other possible heart problems.  You do not need to do anything to prepare before this  test. You may eat and drink normally.  After the echocardiogram is completed, you may return to your normal, everyday life, unless your health care provider tells you not to do that. This information is not intended to replace advice given to you by your health care provider. Make sure you discuss any questions you have with your health care provider. Document Revised: 06/08/2020 Document Reviewed: 06/08/2020 Elsevier Patient Education  2021 [employer].

## 2021-02-05 LAB — LIPID PANEL
Chol/HDL Ratio: 3.1 ratio (ref 0.0–4.4)
Cholesterol, Total: 181 mg/dL (ref 100–199)
HDL: 58 mg/dL (ref >39)
LDL Chol Calc (NIH): 108 mg/dL — ABNORMAL HIGH (ref 0–99)
Triglycerides: 84 mg/dL (ref 0–149)
VLDL Cholesterol Cal: 15 mg/dL (ref 5–40)

## 2021-02-11 DIAGNOSIS — R002 Palpitations: Secondary | ICD-10-CM | POA: Diagnosis not present

## 2021-02-11 DIAGNOSIS — R001 Bradycardia, unspecified: Secondary | ICD-10-CM | POA: Diagnosis not present

## 2021-02-11 DIAGNOSIS — I951 Orthostatic hypotension: Secondary | ICD-10-CM

## 2021-02-11 DIAGNOSIS — I10 Essential (primary) hypertension: Secondary | ICD-10-CM

## 2021-02-18 DIAGNOSIS — I455 Other specified heart block: Secondary | ICD-10-CM

## 2021-02-18 DIAGNOSIS — R001 Bradycardia, unspecified: Secondary | ICD-10-CM

## 2021-02-28 ENCOUNTER — Telehealth: Payer: Self-pay | Admitting: Emergency Medicine

## 2021-02-28 DIAGNOSIS — R001 Bradycardia, unspecified: Secondary | ICD-10-CM

## 2021-02-28 NOTE — Telephone Encounter (Signed)
Patient calling asking about her sleep study being set up. No order for this. Looks like monitor results have a note where Dr. Bing Matter wanted one done, [place], RN called patient with these results but not documentation of sleep study ordered. Will order sleep study now since I can not find documentation that this has been done.

## 2021-03-01 ENCOUNTER — Telehealth: Payer: Self-pay | Admitting: Cardiology

## 2021-03-01 NOTE — Telephone Encounter (Signed)
Called patient informed her that her sleep study was ordered yesterday and sent to precert. Patient aware once I hear back from precert we will get her scheduled. She understood no further questions.

## 2021-03-01 NOTE — Telephone Encounter (Signed)
PT is calling in she is unclear of what she needs to do for her sleep study.Please advise

## 2021-03-03 NOTE — Telephone Encounter (Signed)
Patient scheduled. Called and informed patient of appointment date and time.

## 2021-03-03 NOTE — Telephone Encounter (Signed)
Prior Authorization for spilt night sleep study sent to [facility] via web portal. No PA is required. Decision ID: [id number].

## 2021-03-03 NOTE — Telephone Encounter (Signed)
-----   Message from Hazle Quant, RN sent at 02/28/2021  9:43 AM EDT ----- Regarding: please precert Please precert sleep study for bradycardia per Dr. Bing Matter.

## 2021-03-07 ENCOUNTER — Other Ambulatory Visit (HOSPITAL_COMMUNITY): Payer: 59

## 2021-03-22 ENCOUNTER — Other Ambulatory Visit (HOSPITAL_COMMUNITY): Payer: 59

## 2021-03-24 ENCOUNTER — Ambulatory Visit: Payer: 59 | Admitting: Cardiology

## 2021-04-01 ENCOUNTER — Other Ambulatory Visit: Payer: Self-pay

## 2021-04-01 ENCOUNTER — Ambulatory Visit (HOSPITAL_COMMUNITY): Payer: 59 | Attending: Internal Medicine

## 2021-04-01 DIAGNOSIS — I951 Orthostatic hypotension: Secondary | ICD-10-CM | POA: Insufficient documentation

## 2021-04-01 DIAGNOSIS — R002 Palpitations: Secondary | ICD-10-CM | POA: Diagnosis not present

## 2021-04-01 DIAGNOSIS — R001 Bradycardia, unspecified: Secondary | ICD-10-CM | POA: Insufficient documentation

## 2021-04-01 DIAGNOSIS — I10 Essential (primary) hypertension: Secondary | ICD-10-CM | POA: Diagnosis not present

## 2021-04-01 LAB — ECHOCARDIOGRAM COMPLETE
Area-P 1/2: 5.38 cm2
S' Lateral: 3.5 cm

## 2021-04-12 ENCOUNTER — Ambulatory Visit: Payer: 59 | Admitting: Cardiology

## 2021-04-20 ENCOUNTER — Other Ambulatory Visit: Payer: Self-pay

## 2021-04-22 ENCOUNTER — Encounter: Payer: Self-pay | Admitting: Cardiology

## 2021-04-22 ENCOUNTER — Other Ambulatory Visit: Payer: Self-pay

## 2021-04-22 ENCOUNTER — Ambulatory Visit (INDEPENDENT_AMBULATORY_CARE_PROVIDER_SITE_OTHER): Payer: 59 | Admitting: Cardiology

## 2021-04-22 VITALS — BP 128/76 | HR 76 | Ht 68.0 in | Wt 156.0 lb

## 2021-04-22 DIAGNOSIS — G4733 Obstructive sleep apnea (adult) (pediatric): Secondary | ICD-10-CM

## 2021-04-22 DIAGNOSIS — I951 Orthostatic hypotension: Secondary | ICD-10-CM | POA: Diagnosis not present

## 2021-04-22 DIAGNOSIS — I10 Essential (primary) hypertension: Secondary | ICD-10-CM

## 2021-04-22 DIAGNOSIS — R001 Bradycardia, unspecified: Secondary | ICD-10-CM | POA: Diagnosis not present

## 2021-04-22 NOTE — Patient Instructions (Signed)
Medication Instructions:  Your physician recommends that you continue on your current medications as directed. Please refer to the Current Medication list given to you today.  *If you need a refill on your cardiac medications before your next appointment, please call your pharmacy*   Lab Work:  If you have labs (blood work) drawn today and your tests are completely normal, you will receive your results only by: MyChart Message (if you have MyChart) OR A paper copy in the mail If you have any lab test that is abnormal or we need to change your treatment, we will call you to review the results.   Testing/Procedures:    Follow-Up: At [facility], you and your health needs are our priority.  As part of our continuing mission to provide you with exceptional heart care, we have created designated Provider Care Teams.  These Care Teams include your primary Cardiologist (physician) and Advanced Practice Providers (APPs -  Physician Assistants and Nurse Practitioners) who all work together to provide you with the care you need, when you need it.  We recommend signing up for the patient portal called "MyChart".  Sign up information is provided on this After Visit Summary.  MyChart is used to connect with patients for Virtual Visits (Telemedicine).  Patients are able to view lab/test results, encounter notes, upcoming appointments, etc.  Non-urgent messages can be sent to your provider as well.   To learn more about what you can do with MyChart, go to [email or web address].    Your next appointment:   6 month(s)  The format for your next appointment:   In Person  Provider:   Robert Krasowski, MD   Other Instructions   

## 2021-04-22 NOTE — Progress Notes (Signed)
Cardiology Office Note:    Date:  04/22/2021   ID:  Deanna Archer, DOB Nov 13, 1970, MRN [id number]  PCP:  Ethelda Chick, MD  Cardiologist:  Gypsy Balsam, MD    Referring MD: Ethelda Chick, MD   Chief Complaint  Patient presents with   Follow-up    History of Present Illness:    Deanna Archer is a 50 y.o. female with past medical history significant for multiple sclerosis, essential hypertension, chronic pain syndrome, bipolar disorder, dizziness, orthostatic hypotension.  She was sent to [place] for evaluation of bradycardia.  She did wear a monitor which showed 4 pauses longest 1 3.8 seconds all of them happening at night.  The mechanism was sinus arrest.  She comes here to discuss those issues.  She snores, she is scheduled to have a sleep study.  Still described to have some dizziness but does dizziness happen when she gets up very quickly when she turns her head very quickly.  She is trying to start well-hydrated that seems to be helping.  Past Medical History:  Diagnosis Date   Abnormal MRI 10/29/2014   Anxiety 10/27/2020   Benign essential hypertension 07/30/2013   Benign recurrent vertigo 03/16/2020   Bipolar 1 disorder (HCC) 12/04/2013   Chronic pain syndrome 03/31/2019   Demyelinating disease (HCC) 12/19/2014   Elevated alkaline phosphatase level 08/26/2014   Gastroesophageal reflux disease without esophagitis 03/31/2019   Generalized anxiety disorder 03/16/2020   Hypertension 10/27/2020   Long term (current) use of opiate analgesic 01/31/2021   Menopausal hot flushes 12/25/2013   Morbid obesity due to excess calories (HCC) 03/31/2019   MS (multiple sclerosis) (HCC)    OSA (obstructive sleep apnea) 02/06/2019   Perennial allergic rhinitis with seasonal variation 03/16/2020   Prediabetes 07/07/2018   Presbyopia of both eyes 03/05/2015   Recurrent major depressive disorder (HCC) 03/31/2019   Relapsing-remitting multiple sclerosis (HCC) 01/11/2015   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 Added automatically from request for surgery [id number]  Formatting of this note might be different from the original. Added automatically from request for surgery [id number]   Urge incontinence of urine 12/19/2014   Weakness 12/10/2014    Past Surgical History:  Procedure Laterality Date   ABDOMINAL HYSTERECTOMY     BACK SURGERY     BREAST SURGERY      Current Medications: Current Meds  Medication Sig   albuterol (VENTOLIN HFA) 108 (90 Base) MCG/ACT inhaler Inhale 2 puffs into the lungs every 6 (six) hours as needed for wheezing or shortness of breath.   amitriptyline (ELAVIL) 10 MG tablet Take 10 mg by mouth at bedtime.   amphetamine-dextroamphetamine (ADDERALL) 15 MG tablet Take 1 tablet by mouth daily.   busPIRone (BUSPAR) 15 MG tablet Take 1 tablet by mouth in the morning and at bedtime.   famotidine (PEPCID) 40 MG tablet Take 40 mg by mouth daily.   fluticasone (FLOVENT HFA) 110 MCG/ACT inhaler Inhale 1 puff into the lungs daily.   furosemide (LASIX) 20 MG tablet Take 20 mg by mouth as needed for fluid.   linaclotide (LINZESS) 72 MCG capsule Take 72 mcg by mouth as needed for constipation.   meclizine (ANTIVERT) 12.5 MG tablet Take 1 tablet by mouth daily.   metoCLOPramide (REGLAN) 5 MG tablet Take 5 mg by mouth 4 (four) times daily.   mirabegron ER (MYRBETRIQ) 25  MG TB24 tablet Take 25 mg by mouth daily.   mirtazapine (REMERON) 45 MG tablet Take 45 mg by mouth at bedtime.   morphine (MSIR) 15 MG tablet Take 15 mg by mouth every 6 (six) hours as needed for severe pain or moderate pain.   MOVANTIK 25 MG TABS tablet Take 25 mg by mouth every morning.   Naldemedine Tosylate (SYMPROIC) 0.2 MG TABS Take 0.2 mg by mouth daily.   pantoprazole (PROTONIX) 40 MG tablet Take 40 mg by mouth daily.   risperiDONE (RISPERDAL) 2 MG tablet Take 2 mg by  mouth 2 (two) times daily.   tiZANidine (ZANAFLEX) 4 MG tablet Take 4 mg by mouth 3 (three) times daily.   Vitamin D, Ergocalciferol, (DRISDOL) 1.25 MG (50000 UNIT) CAPS capsule Take 50,000 Units by mouth every 7 (seven) days.     Allergies:   Latex and Ibuprofen   Social History   Socioeconomic History   Marital status: Married    Spouse name: Not on file   Number of children: Not on file   Years of education: Not on file   Highest education level: Not on file  Occupational History   Not on file  Tobacco Use   Smoking status: Every Day    Pack years: 0.00    Types: Cigars   Smokeless tobacco: Never  Substance and Sexual Activity   Alcohol use: Never   Drug use: Never   Sexual activity: Not on file  Other Topics Concern   Not on file  Social History Narrative   Not on file   Social Determinants of Health   Financial Resource Strain: Not on file  Food Insecurity: Not on file  Transportation Needs: Not on file  Physical Activity: Not on file  Stress: Not on file  Social Connections: Not on file     Family History: The patient's family history includes Hypertension in her maternal grandfather and mother; Stroke in her maternal grandfather. ROS:   Please see the history of present illness.    All 14 point review of systems negative except as described per history of present illness  EKGs/Labs/Other Studies Reviewed:      Recent Labs: 01/17/2021: ALT 14; BUN 10; Creatinine, Ser 1.02; Hemoglobin 15.0; Platelets 375; Potassium 3.2; Sodium 140  Recent Lipid Panel    Component Value Date/Time   CHOL 181 02/04/2021 1004   TRIG 84 02/04/2021 1004   HDL 58 02/04/2021 1004   CHOLHDL 3.1 02/04/2021 1004   LDLCALC 108 (H) 02/04/2021 1004    Physical Exam:    VS:  BP 128/76 (BP Location: Right Arm, Patient Position: Sitting)   Pulse 76   Ht 5\' 8"  (1.727 m)   Wt 156 lb (70.8 kg)   SpO2 97%   BMI 23.72 kg/m     Wt Readings from Last 3 Encounters:  04/22/21 156  lb (70.8 kg)  02/04/21 168 lb (76.2 kg)  01/17/21 165 lb (74.8 kg)     GEN:  Well nourished, well developed in no acute distress HEENT: Normal NECK: No JVD; No carotid bruits LYMPHATICS: No lymphadenopathy CARDIAC: RRR, no murmurs, no rubs, no gallops RESPIRATORY:  Clear to auscultation without rales, wheezing or rhonchi  ABDOMEN: Soft, non-tender, non-distended MUSCULOSKELETAL:  No edema; No deformity  SKIN: Warm and dry LOWER EXTREMITIES: no swelling NEUROLOGIC:  Alert and oriented x 3 PSYCHIATRIC:  Normal affect   ASSESSMENT:    1. Sinus bradycardia   2. Orthostatic hypotension   3. Benign essential  hypertension   4. OSA (obstructive sleep apnea)    PLAN:    In order of problems listed above:  Sinus bradycardia.  Awaiting sleep study to see if she got any significant sleep apnea.  We did have some episode of sinus arrest during the night with the longest pause of 3.8 seconds.  But again it happened during the night.  Patient was asymptomatic.  During the day she got multiple episode of dizziness which no significant bradycardia on the EKG.  I do not think bradycardia is responsible for her symptoms during the day. Orthostatic hypotension again we discussed the issue that she needs to stay well-hydrated and I strongly encouraged her to drink plenty of fluids. Essential hypertension: Blood pressure seems to be well controlled continue present management. Dyslipidemia I did review her K PN which show me LDL of 108 HDL 58 that was acceptable numbers.  Continue present management.   Medication Adjustments/Labs and Tests Ordered: Current medicines are reviewed at length with the patient today.  Concerns regarding medicines are outlined above.  No orders of the defined types were placed in this encounter.  Medication changes: No orders of the defined types were placed in this encounter.   Signed, Georgeanna Lea, MD, [facility] 04/22/2021 11:47 AM    [facility]

## 2021-05-11 ENCOUNTER — Encounter: Payer: Self-pay | Admitting: Family

## 2021-05-11 ENCOUNTER — Telehealth: Payer: 59 | Admitting: Family

## 2021-05-11 DIAGNOSIS — R609 Edema, unspecified: Secondary | ICD-10-CM | POA: Diagnosis not present

## 2021-05-11 DIAGNOSIS — G35 Multiple sclerosis: Secondary | ICD-10-CM

## 2021-05-11 MED ORDER — FUROSEMIDE 20 MG PO TABS: 20.0000 mg | ORAL_TABLET | ORAL | 0 refills | Status: AC | PRN

## 2021-05-11 NOTE — Progress Notes (Signed)
Virtual Visit Consent   Deanna Archer, you are scheduled for a virtual visit with a [facility] provider today.     Just as with appointments in the office, your consent must be obtained to participate.  Your consent will be active for this visit and any virtual visit you may have with one of our providers in the next 365 days.     If you have a MyChart account, a copy of this consent can be sent to you electronically.  All virtual visits are billed to your insurance company just like a traditional visit in the office.    As this is a virtual visit, video technology does not allow for your provider to perform a traditional examination.  This may limit your provider's ability to fully assess your condition.  If your provider identifies any concerns that need to be evaluated in person or the need to arrange testing (such as labs, EKG, etc.), we will make arrangements to do so.     Although advances in technology are sophisticated, we cannot ensure that it will always work on either your end or our end.  If the connection with a video visit is poor, the visit may have to be switched to a telephone visit.  With either a video or telephone visit, we are not always able to ensure that we have a secure connection.     I need to obtain your verbal consent now.   Are you willing to proceed with your visit today?    Deanna Archer has provided verbal consent on 05/11/2021 for a virtual visit (video or telephone).   Jannifer Rodney, FNP   Date: 05/11/2021 11:33 AM   Virtual Visit via Video Note   I, Jannifer Rodney, connected with  Deanna Archer  ([id number], 02/18/1971) on 05/11/21 at 11:30 AM EDT by a video-enabled telemedicine application and verified that I am speaking with the correct person using two identifiers.  Location: Patient: Virtual Visit Location Patient: Home Provider: Virtual Visit Location Provider: Home   I discussed the limitations of evaluation and management by telemedicine and  the availability of in person appointments. The patient expressed understanding and agreed to proceed.    History of Present Illness: Deanna Archer is a 50 y.o. who identifies as a female who was assigned female at birth, and is being seen today for swelling in bilateral legs. She reports she has taken lasix 20 mg daily since 2020. However, over the last year she has only been taking as needed. She is out of the prescription.   She is followed by Cardiologists last month and is aware of the peripheral edema. She has MS.   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/2020   Benign recurrent vertigo 03/16/2020   Generalized anxiety disorder 03/16/2020   Perennial allergic rhinitis with seasonal variation 03/16/2020   Psychophysiological insomnia 03/16/2020   Spondylosis of lumbar region without myelopathy or radiculopathy 12/12/2019   Chronic pain syndrome 03/31/2019   Gastroesophageal reflux disease without esophagitis 03/31/2019   Recurrent major depressive disorder (HCC) 03/31/2019   OSA (obstructive sleep apnea) 02/06/2019   Multiple sclerosis (HCC) 01/11/2015   Demyelinating disease (HCC) 12/19/2014   Urge incontinence of urine 12/19/2014   Weakness 12/10/2014  Abnormal MRI 10/29/2014   Elevated alkaline phosphatase level 08/26/2014   Menopausal hot flushes 12/25/2013   Bipolar 1 disorder (HCC) 12/04/2013   Benign essential hypertension 07/30/2013    Allergies:  Allergies  Allergen Reactions   Latex Itching   Ibuprofen Other (See Comments)    Can not take since having gastric weight loss surgery   Medications:  Current Outpatient Medications:    albuterol (VENTOLIN HFA) 108 (90 Base) MCG/ACT inhaler, Inhale 2 puffs into the  lungs every 6 (six) hours as needed for wheezing or shortness of breath., Disp: , Rfl:    amitriptyline (ELAVIL) 10 MG tablet, Take 10 mg by mouth at bedtime., Disp: , Rfl:    amphetamine-dextroamphetamine (ADDERALL) 15 MG tablet, Take 1 tablet by mouth daily., Disp: , Rfl:    busPIRone (BUSPAR) 15 MG tablet, Take 1 tablet by mouth in the morning and at bedtime., Disp: , Rfl:    famotidine (PEPCID) 40 MG tablet, Take 40 mg by mouth daily., Disp: , Rfl:    fluticasone (FLOVENT HFA) 110 MCG/ACT inhaler, Inhale 1 puff into the lungs daily., Disp: , Rfl:    furosemide (LASIX) 20 MG tablet, Take 1 tablet (20 mg total) by mouth as needed for fluid., Disp: 30 tablet, Rfl: 0   linaclotide (LINZESS) 72 MCG capsule, Take 72 mcg by mouth as needed for constipation., Disp: , Rfl:    meclizine (ANTIVERT) 12.5 MG tablet, Take 1 tablet by mouth daily., Disp: , Rfl:    metoCLOPramide (REGLAN) 5 MG tablet, Take 5 mg by mouth 4 (four) times daily., Disp: , Rfl:    mirabegron ER (MYRBETRIQ) 25 MG TB24 tablet, Take 25 mg by mouth daily., Disp: , Rfl:    mirtazapine (REMERON) 45 MG tablet, Take 45 mg by mouth at bedtime., Disp: , Rfl:    morphine (MSIR) 15 MG tablet, Take 15 mg by mouth every 6 (six) hours as needed for severe pain or moderate pain., Disp: , Rfl:    MOVANTIK 25 MG TABS tablet, Take 25 mg by mouth every morning., Disp: , Rfl:    Naldemedine Tosylate (SYMPROIC) 0.2 MG TABS, Take 0.2 mg by mouth daily., Disp: , Rfl:    pantoprazole (PROTONIX) 40 MG tablet, Take 40 mg by mouth daily., Disp: , Rfl:    risperiDONE (RISPERDAL) 2 MG tablet, Take 2 mg by mouth 2 (two) times daily., Disp: , Rfl:    tiZANidine (ZANAFLEX) 4 MG tablet, Take 4 mg by mouth 3 (three) times daily., Disp: , Rfl:    Vitamin D, Ergocalciferol, (DRISDOL) 1.25 MG (50000 UNIT) CAPS capsule, Take 50,000 Units by mouth every 7 (seven) days., Disp: , Rfl:   Observations/Objective: Patient is well-developed, well-nourished in no acute  distress.  Resting comfortably  at home.  Head is normocephalic, atraumatic.  No labored breathing.  Speech is clear and coherent with logical content.  Patient is alert and oriented at baseline.  Mild swelling in bilateral legs  Assessment and Plan: 1. Peripheral edema - furosemide (LASIX) 20 MG tablet; Take 1 tablet (20 mg total) by mouth as needed for fluid.  Dispense: 30 tablet; Refill: 0  2. Multiple sclerosis (HCC) I will refill lasix 20 mg today for 1 month. She needs to get further refills from PCP or Cardiologists Encouraged elevation Low salt diet Compression hose  Follow Up Instructions: I discussed the assessment and treatment plan with the patient. The patient was provided an opportunity to ask questions and all were answered. The patient agreed  with the plan and demonstrated an understanding of the instructions.  A copy of instructions were sent to the patient via MyChart.  The patient was advised to call back or seek an in-person evaluation if the symptoms worsen or if the condition fails to improve as anticipated.  Time:  I spent 8 minutes with the patient via telehealth technology discussing the above problems/concerns.    Jannifer Rodney, FNP

## 2021-05-13 ENCOUNTER — Other Ambulatory Visit: Payer: Self-pay

## 2021-05-13 ENCOUNTER — Ambulatory Visit (HOSPITAL_BASED_OUTPATIENT_CLINIC_OR_DEPARTMENT_OTHER): Payer: 59 | Attending: Cardiology | Admitting: Cardiovascular Disease

## 2021-05-13 DIAGNOSIS — G4730 Sleep apnea, unspecified: Secondary | ICD-10-CM | POA: Insufficient documentation

## 2021-05-13 DIAGNOSIS — G478 Other sleep disorders: Secondary | ICD-10-CM

## 2021-05-13 DIAGNOSIS — G4736 Sleep related hypoventilation in conditions classified elsewhere: Secondary | ICD-10-CM | POA: Insufficient documentation

## 2021-05-13 DIAGNOSIS — R001 Bradycardia, unspecified: Secondary | ICD-10-CM | POA: Diagnosis not present

## 2021-05-18 ENCOUNTER — Telehealth: Payer: Self-pay | Admitting: Cardiology

## 2021-05-18 NOTE — Telephone Encounter (Signed)
Patient was calling in to get the results from her sleep study. Please advise

## 2021-05-19 NOTE — Telephone Encounter (Signed)
Called patient to inform her her sleep study has not been read by dr Tresa Endo yet. Patient was grateful for the call.

## 2021-05-29 ENCOUNTER — Encounter (HOSPITAL_BASED_OUTPATIENT_CLINIC_OR_DEPARTMENT_OTHER): Payer: Self-pay | Admitting: Cardiovascular Disease

## 2021-05-29 NOTE — Procedures (Signed)
Patient Name: Deanna Archer, Deanna Archer Date: 05/13/2021 Gender: Female D.O.B: 11-11-1970 Age (years): 49 Referring Provider: Georgeanna Lea Height (inches): 68 Interpreting Physician: Nicki Guadalajara MD, ABSM Weight (lbs): 156 RPSGT: Armen Pickup BMI: 24 MRN: [id number] Neck Size: 12.50  CLINICAL INFORMATION Sleep Study Type: NPSG  Indication for sleep study: OSA  Epworth Sleepiness Score: 1  SLEEP STUDY TECHNIQUE As per the AASM Manual for the Scoring of Sleep and Associated Events v2.3 (April 2016) with a hypopnea requiring 4% desaturations.  The channels recorded and monitored were frontal, central and occipital EEG, electrooculogram (EOG), submentalis EMG (chin), nasal and oral airflow, thoracic and abdominal wall motion, anterior tibialis EMG, snore microphone, electrocardiogram, and pulse oximetry.  MEDICATIONS albuterol (VENTOLIN HFA) 108 (90 Base) MCG/ACT inhaler amitriptyline (ELAVIL) 10 MG tablet amphetamine-dextroamphetamine (ADDERALL) 15 MG tablet busPIRone (BUSPAR) 15 MG tablet famotidine (PEPCID) 40 MG tablet fluticasone (FLOVENT HFA) 110 MCG/ACT inhaler furosemide (LASIX) 20 MG tablet linaclotide (LINZESS) 72 MCG capsule meclizine (ANTIVERT) 12.5 MG tablet metoCLOPramide (REGLAN) 5 MG tablet mirabegron ER (MYRBETRIQ) 25 MG TB24 tablet mirtazapine (REMERON) 45 MG tablet morphine (MSIR) 15 MG tablet MOVANTIK 25 MG TABS tablet Naldemedine Tosylate (SYMPROIC) 0.2 MG TABS pantoprazole (PROTONIX) 40 MG tablet risperiDONE (RISPERDAL) 2 MG tablet (Expired) tiZANidine (ZANAFLEX) 4 MG tablet Vitamin D, Ergocalciferol, (DRISDOL) 1.25 MG (50000 UNIT) CAPS capsule  Medications self-administered by patient taken the night of the study : ELAVIL, LINZESS, REGLAN, REMERON, MSIR, SYMPROIC, RISPERDAL, ZANAFLEX  SLEEP ARCHITECTURE The study was initiated at 10:11:41 PM and ended at 4:44:42 AM.  Sleep onset time was 32.7 minutes and the sleep efficiency was  85.0%%. The total sleep time was 334 minutes.  Stage REM latency was 189.5 minutes.  The patient spent 2.5%% of the night in stage N1 sleep, 81.7%% in stage N2 sleep, 1.8%% in stage N3 and 13.9% in REM.  Alpha intrusion was absent.  Supine sleep was 68.71%.  RESPIRATORY PARAMETERS The overall apnea/hypopnea index (AHI) was 1.3 per hour. The respiratory disturbance index (RDI) was 7.4/h. There were 7 total apneas, including 7 obstructive, 0 central and 0 mixed apneas. There were 0 hypopneas and 34 RERAs.  The AHI during Stage REM sleep was 9.0 per hour.  AHI while supine was 1.8 per hour.  The mean oxygen saturation was 94.6%. The minimum SpO2 during sleep was 87.0%.  Soft snoring was noted during this study.  CARDIAC DATA The 2 lead EKG demonstrated sinus rhythm. The mean heart rate was 63.1 beats per minute. Other EKG findings include: None.  LEG MOVEMENT DATA The total PLMS were 0 with a resulting PLMS index of 0.0. Associated arousal with leg movement index was 0.0 .  IMPRESSIONS - Increased upper airway resistance (UARS) without significant obstructive sleep apnea overall (AHI 1.3/h with RDI 7.4/h); however, mild sleep apnea was present during REM sleep. (AHI 9.0/h). - Mild oxygen desaturation to a nadir of 87.0%. - The patient snored with soft snoring volume. - No cardiac abnormalities were noted during this study. - Clinically significant periodic limb movements did not occur during sleep. No significant associated arousals.  DIAGNOSIS - Increased upper airway resistance syndrome (UARS) - Sleep apnea, unspecified G 47.30 - Nocturnal Hypoxemia (G47.36)  RECOMMENDATIONS - At present there is no indication for CPAP therapy. - Effort should be made to optimize nasal and oropharyngeal patency. - Avoid alcohol, sedatives and other CNS depressants that may worsen sleep apnea and disrupt normal sleep architecture. - Sleep hygiene should be reviewed to assess  factors that may  improve sleep quality. - Weight management and regular exercise should be initiated or continued if appropriate.  [Electronically signed] 05/29/2021 06:36 PM  Nicki Guadalajara MD, [facility], ABSM Diplomate, American Board of Sleep Medicine   NPI: [id number] [facility] PH: [phone]   FX: [phone] ACCREDITED BY THE AMERICAN ACADEMY OF SLEEP MEDICINE

## 2021-06-01 ENCOUNTER — Telehealth: Payer: Self-pay | Admitting: *Deleted

## 2021-06-01 NOTE — Telephone Encounter (Signed)
Patient was notified of sleep study results. She voiced her understanding and asked me where does she go from here since her sleep study is negative, and she feels that her heart rate is too low. I deferred her back to her primary cardiologist, Dr Bing Matter for recommendations.

## 2021-06-01 NOTE — Telephone Encounter (Signed)
Opened in error

## 2021-06-08 ENCOUNTER — Other Ambulatory Visit: Payer: Self-pay | Admitting: Family

## 2021-06-08 DIAGNOSIS — R609 Edema, unspecified: Secondary | ICD-10-CM

## 2021-12-21 ENCOUNTER — Encounter: Payer: Self-pay | Admitting: Cardiology

## 2021-12-21 ENCOUNTER — Other Ambulatory Visit: Payer: Self-pay

## 2021-12-21 ENCOUNTER — Ambulatory Visit (INDEPENDENT_AMBULATORY_CARE_PROVIDER_SITE_OTHER): Payer: 59 | Admitting: Cardiology

## 2021-12-21 VITALS — BP 132/90 | HR 104 | Ht 68.0 in | Wt 153.0 lb

## 2021-12-21 DIAGNOSIS — G4733 Obstructive sleep apnea (adult) (pediatric): Secondary | ICD-10-CM | POA: Diagnosis not present

## 2021-12-21 DIAGNOSIS — I951 Orthostatic hypotension: Secondary | ICD-10-CM | POA: Diagnosis not present

## 2021-12-21 DIAGNOSIS — R001 Bradycardia, unspecified: Secondary | ICD-10-CM

## 2021-12-21 DIAGNOSIS — R079 Chest pain, unspecified: Secondary | ICD-10-CM

## 2021-12-21 DIAGNOSIS — I10 Essential (primary) hypertension: Secondary | ICD-10-CM

## 2021-12-21 MED ORDER — METOPROLOL TARTRATE 100 MG PO TABS
100.0000 mg | ORAL_TABLET | Freq: Once | ORAL | 0 refills | Status: AC
Start: 2021-12-21 — End: 2021-12-21

## 2021-12-21 NOTE — Patient Instructions (Signed)
Medication Instructions:  Your physician recommends that you continue on your current medications as directed. Please refer to the Current Medication list given to you today.  *If you need a refill on your cardiac medications before your next appointment, please call your pharmacy*   Lab Work:  BMP 1 week before CT  If you have labs (blood work) drawn today and your tests are completely normal, you will receive your results only by: MyChart Message (if you have MyChart) OR A paper copy in the mail If you have any lab test that is abnormal or we need to change your treatment, we will call you to review the results.   Testing/Procedures:   Your cardiac CT will be scheduled at one of the below locations:   [facility] [address] [place], [place] [address] [phone]  OR  [facility] [address], [place] [address] [phone]  If scheduled at [facility], please arrive at the [facility] main entrance (entrance A) of [facility] 30 minutes prior to test start time. You can use the FREE valet parking offered at the main entrance (encouraged to control the heart rate for the test) Proceed to the [facility] Radiology Department (first floor) to check-in and test prep.  If scheduled at [facility], please arrive 15 mins early for check-in and test prep.  Please follow these instructions carefully (unless otherwise directed):  On the Night Before the Test: Be sure to Drink plenty of water. Do not consume any caffeinated/decaffeinated beverages or chocolate 12 hours prior to your test. Do not take any antihistamines 12 hours prior to your test.  On the Day of the Test: Drink plenty of water until 1 hour prior to the test. Do not eat any food 4 hours prior to the test. You may take your regular medications prior to the test.  Take metoprolol (Lopressor) two  hours prior to test. HOLD Furosemide morning of the test. FEMALES- please wear underwire-free bra if available, avoid dresses & tight clothing       After the Test: Drink plenty of water. After receiving IV contrast, you may experience a mild flushed feeling. This is normal. On occasion, you may experience a mild rash up to 24 hours after the test. This is not dangerous. If this occurs, you can take Benadryl 25 mg and increase your fluid intake. If you experience trouble breathing, this can be serious. If it is severe call 911 IMMEDIATELY. If it is mild, please call our office. If you take any of these medications: Glipizide/Metformin, Avandament, Glucavance, please do not take 48 hours after completing test unless otherwise instructed.  We will call to schedule your test 2-4 weeks out understanding that some insurance companies will need an authorization prior to the service being performed.   For non-scheduling related questions, please contact the cardiac imaging nurse navigator should you have any questions/concerns: Rockwell Alexandria, Cardiac Imaging Nurse Navigator Larey Brick, Cardiac Imaging Nurse Navigator [facility] Direct Office Dial: [phone]   For scheduling needs, including cancellations and rescheduling, please call [place], [phone].    Follow-Up: At [facility], you and your health needs are our priority.  As part of our continuing mission to provide you with exceptional heart care, we have created designated Provider Care Teams.  These Care Teams include your primary Cardiologist (physician) and Advanced Practice Providers (APPs -  Physician Assistants and Nurse Practitioners)  who all work together to provide you with the care you need, when you need it.  We recommend signing up for the patient portal called "MyChart".  Sign up information is provided on this After Visit Summary.  MyChart is used to connect with patients for Virtual  Visits (Telemedicine).  Patients are able to view lab/test results, encounter notes, upcoming appointments, etc.  Non-urgent messages can be sent to your provider as well.   To learn more about what you can do with MyChart, go to [email or web address].    Your next appointment:   6 month(s)  The format for your next appointment:   In Person  Provider:   Gypsy Balsam, MD    Other Instructions None

## 2021-12-21 NOTE — Progress Notes (Signed)
Cardiology Office Note:    Date:  12/21/2021   ID:  CEILIDH LUKER, DOB 1971-01-10, MRN [id number]  PCP:  Ethelda Chick, MD  Cardiologist:  Gypsy Balsam, MD    Referring MD: Ethelda Chick, MD   No chief complaint on file. I have chest pain  History of Present Illness:    Deanna Archer is a 51 y.o. female with past medical history significant for multiple sclerosis, essential hypertension, chronic pain syndrome, bipolar disorder, dizziness, orthostatic hypotension, sinus bradycardia, sinus arrhythmia with pauses of 3.8 night, obstructive sleep apnea. He is in my office today for follow-up.  Denies having dizziness or passing out.  She did have a sleep study which was unrevealing still have normal explanation for her sinus arrest with pauses of 3.8 seconds at night.  Luckily she denies have any symptomatology meaning there is no passing out.  Described to have some lightheadedness when she is getting up quickly.  She however started complaining of having some chest pain.  She describes 2 type of chest pain at one time she had episode of chest pain into a set of her chest moving all twisting her body was very painful it was few weeks ago and is gone right now she also described to have some tightness and heartburn type of pain in Different situations sometimes when she is sometimes when she sits sometimes when she walks.  Past Medical History:  Diagnosis Date   Abnormal MRI 10/29/2014   Anxiety 10/27/2020   Benign essential hypertension 07/30/2013   Benign recurrent vertigo 03/16/2020   Bipolar 1 disorder (HCC) 12/04/2013   Chronic pain syndrome 03/31/2019   Demyelinating disease (HCC) 12/19/2014   Elevated alkaline phosphatase level 08/26/2014   Gastroesophageal reflux disease without esophagitis 03/31/2019   Generalized anxiety disorder 03/16/2020   Hypertension 10/27/2020   Long term (current) use of opiate analgesic 01/31/2021   Menopausal hot flushes 12/25/2013   Morbid obesity due  to excess calories (HCC) 03/31/2019   MS (multiple sclerosis) (HCC)    OSA (obstructive sleep apnea) 02/06/2019   Perennial allergic rhinitis with seasonal variation 03/16/2020   Prediabetes 07/07/2018   Presbyopia of both eyes 03/05/2015   Recurrent major depressive disorder (HCC) 03/31/2019   Relapsing-remitting multiple sclerosis (HCC) 01/11/2015   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 Added automatically from request for surgery [id number]  Formatting of this note might be different from the original. Added automatically from request for surgery [id number]   Urge incontinence of urine 12/19/2014   Weakness 12/10/2014    Past Surgical History:  Procedure Laterality Date   ABDOMINAL HYSTERECTOMY     BACK SURGERY     BREAST SURGERY      Current Medications: Current Meds  Medication Sig   albuterol (VENTOLIN HFA) 108 (90 Base) MCG/ACT inhaler Inhale 2 puffs into the lungs every 6 (six) hours as needed for wheezing or shortness of breath.   amitriptyline (ELAVIL) 10 MG tablet Take 10 mg by mouth at bedtime.   amphetamine-dextroamphetamine (ADDERALL) 15 MG tablet Take 1 tablet by mouth daily.   Cholecalciferol (VITAMIN D) 50 MCG (2000 UT) CAPS Take 1 capsule by mouth daily.   famotidine (PEPCID) 40 MG tablet Take 40 mg by mouth daily.   furosemide (LASIX) 20 MG tablet Take 1 tablet (20 mg total) by mouth as needed for fluid.   linaclotide (  LINZESS) 72 MCG capsule Take 72 mcg by mouth as needed for constipation.   meclizine (ANTIVERT) 12.5 MG tablet Take 1 tablet by mouth daily.   metoCLOPramide (REGLAN) 5 MG tablet Take 5 mg by mouth 4 (four) times daily.   mirabegron ER (MYRBETRIQ) 25 MG TB24 tablet Take 25 mg by mouth daily.   mirtazapine (REMERON) 45 MG tablet Take 45 mg by mouth at bedtime.   morphine (MSIR) 15 MG tablet Take 15 mg by mouth every 6  (six) hours as needed for severe pain or moderate pain.   Naldemedine Tosylate (SYMPROIC) 0.2 MG TABS Take 0.2 mg by mouth daily.   pantoprazole (PROTONIX) 40 MG tablet Take 40 mg by mouth daily.   risperiDONE (RISPERDAL) 2 MG tablet Take 2 mg by mouth 2 (two) times daily.   tiZANidine (ZANAFLEX) 4 MG tablet Take 4 mg by mouth 3 (three) times daily.     Allergies:   Latex and Ibuprofen   Social History   Socioeconomic History   Marital status: Married    Spouse name: Not on file   Number of children: Not on file   Years of education: Not on file   Highest education level: Not on file  Occupational History   Not on file  Tobacco Use   Smoking status: Every Day    Types: Cigars   Smokeless tobacco: Never  Substance and Sexual Activity   Alcohol use: Never   Drug use: Never   Sexual activity: Not on file  Other Topics Concern   Not on file  Social History Narrative   Not on file   Social Determinants of Health   Financial Resource Strain: Not on file  Food Insecurity: Not on file  Transportation Needs: Not on file  Physical Activity: Not on file  Stress: Not on file  Social Connections: Not on file     Family History: The patient's family history includes Hypertension in her maternal grandfather and mother; Stroke in her maternal grandfather. ROS:   Please see the history of present illness.    All 14 point review of systems negative except as described per history of present illness  EKGs/Labs/Other Studies Reviewed:      Recent Labs: 01/17/2021: ALT 14; BUN 10; Creatinine, Ser 1.02; Hemoglobin 15.0; Platelets 375; Potassium 3.2; Sodium 140  Recent Lipid Panel    Component Value Date/Time   CHOL 181 02/04/2021 1004   TRIG 84 02/04/2021 1004   HDL 58 02/04/2021 1004   CHOLHDL 3.1 02/04/2021 1004   LDLCALC 108 (H) 02/04/2021 1004    Physical Exam:    VS:  Ht 5\' 8"  (1.727 m)    BMI 23.72 kg/m     Wt Readings from Last 3 Encounters:  05/13/21 156 lb  (70.8 kg)  04/22/21 156 lb (70.8 kg)  02/04/21 168 lb (76.2 kg)     GEN:  Well nourished, well developed in no acute distress HEENT: Normal NECK: No JVD; No carotid bruits LYMPHATICS: No lymphadenopathy CARDIAC: RRR, no murmurs, no rubs, no gallops RESPIRATORY:  Clear to auscultation without rales, wheezing or rhonchi  ABDOMEN: Soft, non-tender, non-distended MUSCULOSKELETAL:  No edema; No deformity  SKIN: Warm and dry LOWER EXTREMITIES: no swelling NEUROLOGIC:  Alert and oriented x 3 PSYCHIATRIC:  Normal affect   ASSESSMENT:    1. Sinus bradycardia   2. Orthostatic hypotension   3. Benign essential hypertension   4. OSA (obstructive sleep apnea)    PLAN:    In order of  problems listed above:  Atypical chest pain.  I will ask her to have coronary CT angio to make sure she does not have any obstructive disease.  Obviously symptomatology is somewhat unclear. Orthostatic hypotension encouraged her to drink plenty of fluids. Sinus bradycardia no symptoms no passing out we will continue monitoring it was suspecting obstructive sleep apnea as a cause of her pulse however she does not have it.  We will continue monitoring identity which the point of placement is needed.  But we will continue monitoring Obstructive sleep apnea: Rule out Multiple sclerosis: Stable   Medication Adjustments/Labs and Tests Ordered: Current medicines are reviewed at length with the patient today.  Concerns regarding medicines are outlined above.  No orders of the defined types were placed in this encounter.  Medication changes: No orders of the defined types were placed in this encounter.   Signed, Georgeanna Lea, MD, [facility] 12/21/2021 3:46 PM     [facility]

## 2021-12-22 ENCOUNTER — Other Ambulatory Visit: Payer: Self-pay | Admitting: Family Medicine

## 2021-12-22 DIAGNOSIS — Z1231 Encounter for screening mammogram for malignant neoplasm of breast: Secondary | ICD-10-CM

## 2021-12-30 ENCOUNTER — Other Ambulatory Visit: Payer: Self-pay

## 2021-12-30 ENCOUNTER — Ambulatory Visit
Admission: EM | Admit: 2021-12-30 | Discharge: 2021-12-30 | Disposition: A | Discharge status: Home / Self Care | Payer: 59 | Attending: Emergency Medicine | Admitting: Emergency Medicine

## 2021-12-30 ENCOUNTER — Encounter: Payer: Self-pay | Admitting: Emergency Medicine

## 2021-12-30 DIAGNOSIS — N189 Chronic kidney disease, unspecified: Secondary | ICD-10-CM | POA: Diagnosis not present

## 2021-12-30 NOTE — ED Triage Notes (Signed)
Pt reports her heart specialist has ordered a CT scan for patient on Tuesday 3/7. Pt needing blood work done. When went to [facility] in [place] and sign that were closed and told that she could come here for lab work.  ?Pt called office and nurse stated pt needs BMP.  ?

## 2021-12-30 NOTE — ED Provider Notes (Signed)
Pt reports her heart specialist has ordered a CT scan for patient to be performed on Tuesday 3/7. Pt needing blood work done.  Patient states that when she went to [facility] in [place] there was a sign on the door that were closed and that she could go to urgent care for lab work.  EMR reviewed by me, patient was seen by cardiology at [facility].  She does have a history of chronic kidney disease.  We are happy to provide a BMP for her today and will provide the result to her cardiologist's office once received. ?  ?Theadora Rama Scales, PA-C ?12/30/21 1239 ? ?

## 2021-12-31 LAB — BASIC METABOLIC PANEL
BUN/Creatinine Ratio: 11 (ref 9–23)
BUN: 10 mg/dL (ref 6–24)
CO2: 17 mmol/L — ABNORMAL LOW (ref 20–29)
Calcium: 9.4 mg/dL (ref 8.7–10.2)
Chloride: 110 mmol/L — ABNORMAL HIGH (ref 96–106)
Creatinine, Ser: 0.89 mg/dL (ref 0.57–1.00)
Glucose: 91 mg/dL (ref 70–99)
Potassium: 4.8 mmol/L (ref 3.5–5.2)
Sodium: 145 mmol/L — ABNORMAL HIGH (ref 134–144)
eGFR: 78 mL/min/{1.73_m2} (ref >59)

## 2022-01-02 ENCOUNTER — Telehealth (HOSPITAL_COMMUNITY): Payer: Self-pay | Admitting: *Deleted

## 2022-01-02 NOTE — Telephone Encounter (Signed)
Attempted to call patient regarding upcoming cardiac CT appointment. °Left message on voicemail with name and callback number ° °Janaria Mccammon RN Navigator Cardiac Imaging °[facility] °[phone] Office °[phone] Cell ° °

## 2022-01-03 ENCOUNTER — Other Ambulatory Visit: Payer: Self-pay

## 2022-01-03 ENCOUNTER — Ambulatory Visit (HOSPITAL_COMMUNITY)
Admission: RE | Admit: 2022-01-03 | Discharge: 2022-01-03 | Disposition: A | Discharge status: Home / Self Care | Payer: 59 | Source: Ambulatory Visit | Attending: Cardiology | Admitting: Cardiology

## 2022-01-03 DIAGNOSIS — R931 Abnormal findings on diagnostic imaging of heart and coronary circulation: Secondary | ICD-10-CM | POA: Insufficient documentation

## 2022-01-03 DIAGNOSIS — R079 Chest pain, unspecified: Secondary | ICD-10-CM | POA: Insufficient documentation

## 2022-01-03 DIAGNOSIS — R072 Precordial pain: Secondary | ICD-10-CM | POA: Insufficient documentation

## 2022-01-03 DIAGNOSIS — I2510 Atherosclerotic heart disease of native coronary artery without angina pectoris: Secondary | ICD-10-CM | POA: Diagnosis not present

## 2022-01-03 MED ORDER — IOHEXOL 350 MG/ML SOLN
95.0000 mL | Freq: Once | INTRAVENOUS | Status: AC | PRN
  Administered 2022-01-03: 95 mL via INTRAVENOUS

## 2022-01-03 MED ORDER — NITROGLYCERIN 0.4 MG SL SUBL
0.8000 mg | SUBLINGUAL_TABLET | Freq: Once | SUBLINGUAL | Status: AC
  Administered 2022-01-03: 0.8 mg via SUBLINGUAL

## 2022-01-03 MED ORDER — NITROGLYCERIN 0.4 MG SL SUBL
SUBLINGUAL_TABLET | SUBLINGUAL | Status: AC
  Filled 2022-01-03: qty 2

## 2022-01-04 ENCOUNTER — Ambulatory Visit (HOSPITAL_COMMUNITY)
Admission: RE | Admit: 2022-01-04 | Discharge: 2022-01-04 | Disposition: A | Discharge status: Home / Self Care | Payer: 59 | Source: Ambulatory Visit | Attending: Cardiology | Admitting: Cardiology

## 2022-01-04 ENCOUNTER — Other Ambulatory Visit: Payer: Self-pay | Admitting: Cardiology

## 2022-01-04 DIAGNOSIS — R931 Abnormal findings on diagnostic imaging of heart and coronary circulation: Secondary | ICD-10-CM

## 2022-01-04 DIAGNOSIS — R072 Precordial pain: Secondary | ICD-10-CM

## 2022-01-10 ENCOUNTER — Ambulatory Visit
Admission: RE | Admit: 2022-01-10 | Discharge: 2022-01-10 | Disposition: A | Discharge status: Home / Self Care | Payer: 59 | Source: Ambulatory Visit | Attending: Family Medicine | Admitting: Family Medicine

## 2022-01-10 ENCOUNTER — Other Ambulatory Visit: Payer: Self-pay | Admitting: Family Medicine

## 2022-01-10 ENCOUNTER — Ambulatory Visit: Payer: 59

## 2022-01-10 DIAGNOSIS — Z1231 Encounter for screening mammogram for malignant neoplasm of breast: Secondary | ICD-10-CM

## 2022-01-24 ENCOUNTER — Telehealth: Payer: Self-pay | Admitting: *Deleted

## 2022-01-24 NOTE — Telephone Encounter (Signed)
Refill request for Amitriptyline was declined today, needs to be refilled by Primary Care Physician ?

## 2022-03-12 ENCOUNTER — Other Ambulatory Visit: Payer: Self-pay | Admitting: Cardiology

## 2022-10-04 ENCOUNTER — Ambulatory Visit: Payer: 59 | Attending: Cardiology | Admitting: Cardiology

## 2023-01-16 IMAGING — MG DIGITAL SCREENING IMPLANTS W/ CAD
9 of 12 series · 9 of 28 positions shown · non-contrast
Comparison: Previous exam(s).

CLINICAL DATA: Screening.

EXAM:
DIGITAL SCREENING BILATERAL MAMMOGRAM WITH IMPLANTS AND CAD
TECHNIQUE: Bilateral screening digital craniocaudal and mediolateral oblique
mammograms were obtained. The images were evaluated with
computer-aided detection. Standard and/or implant displaced views
were performed.

[R MLO]
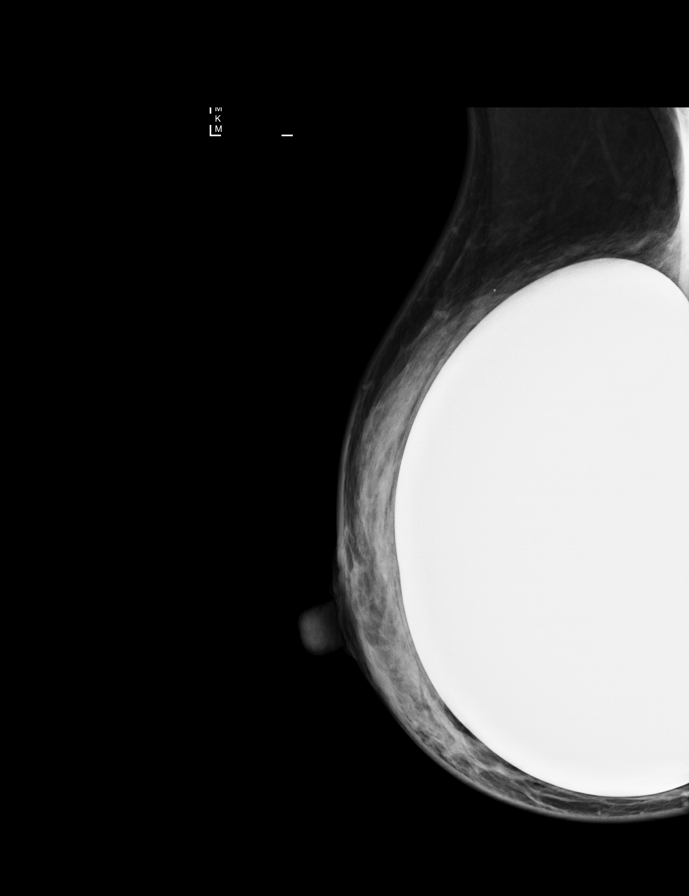

[R CC]
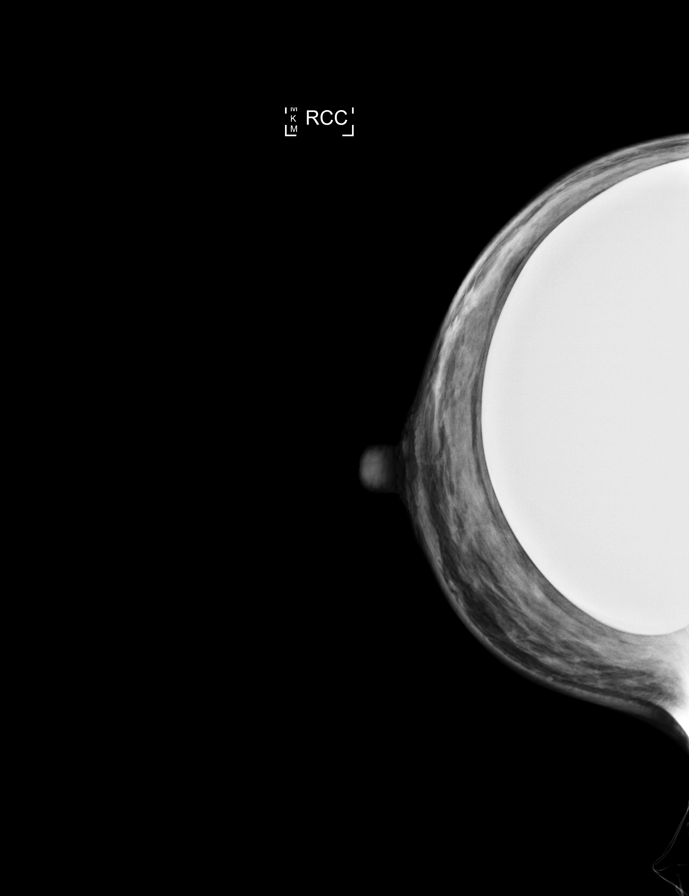

[L MLO]
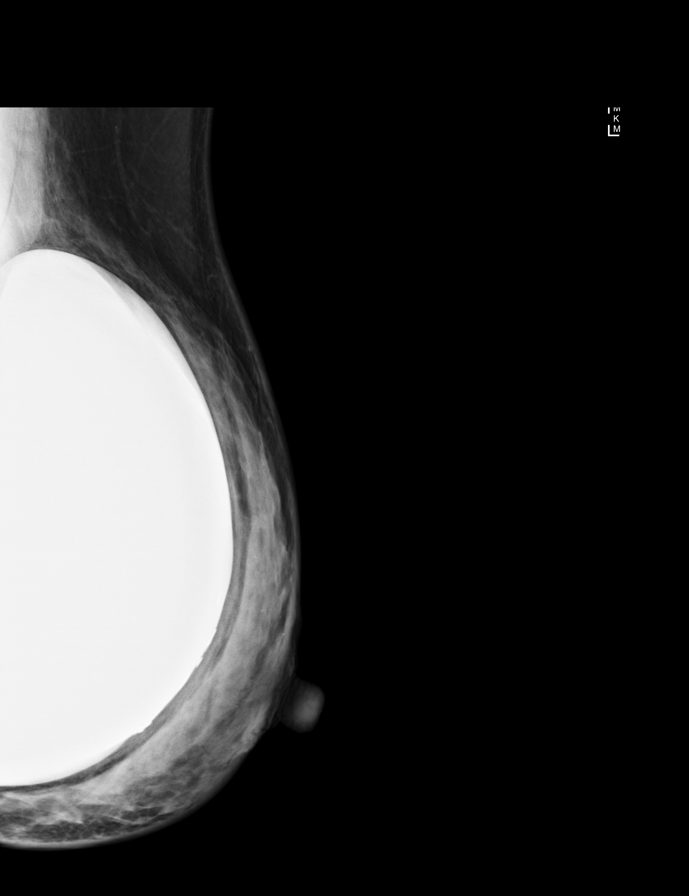

[L CC]
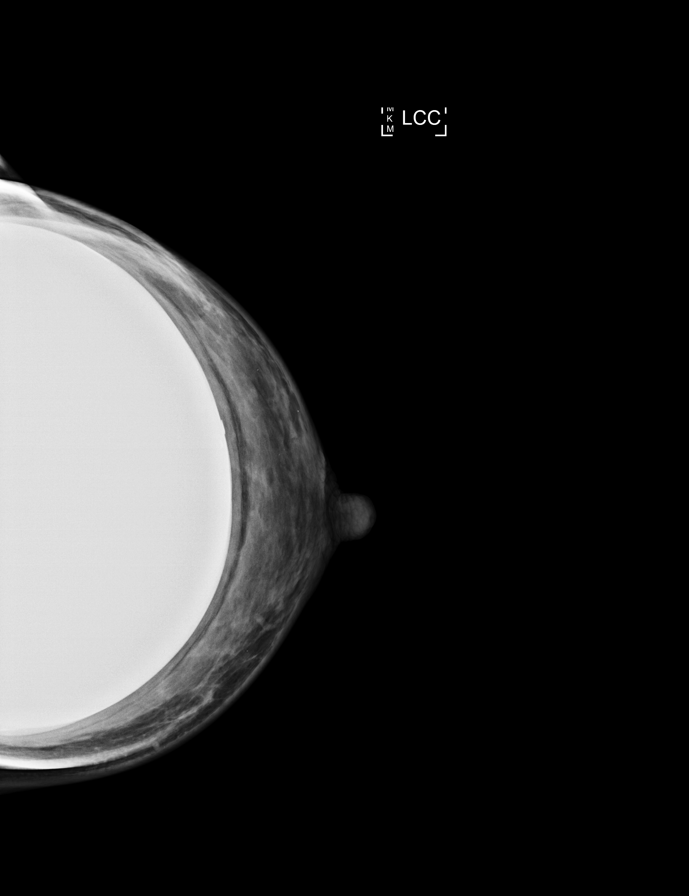

[R MLO synth-2D]
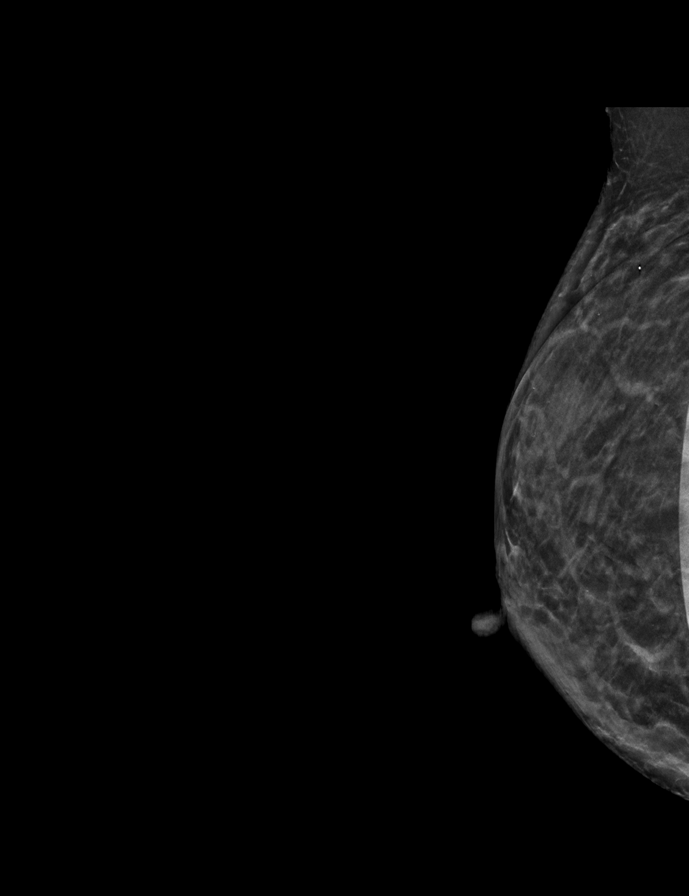

[L CC synth-2D]
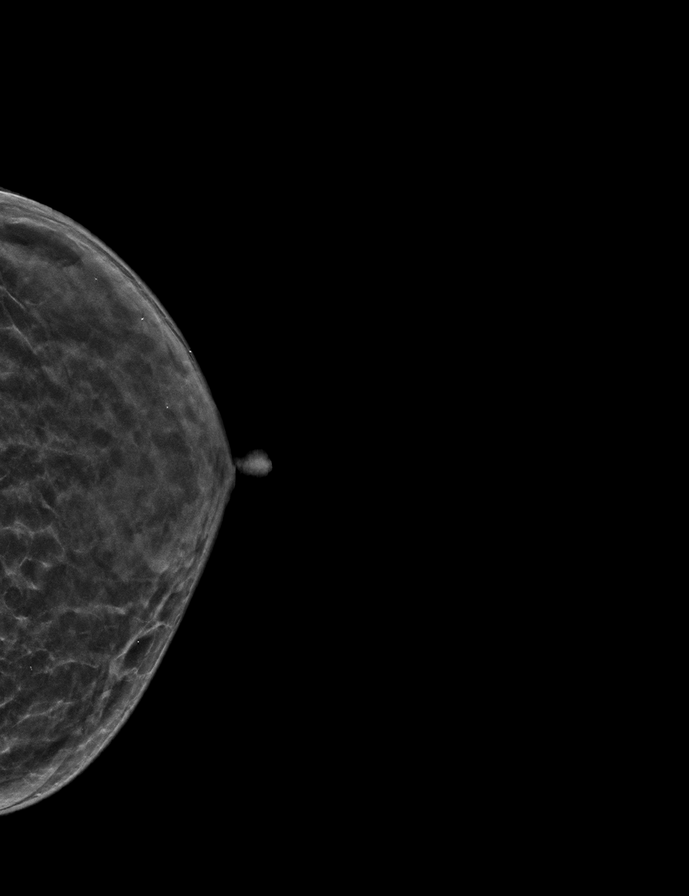

[R CC synth-2D]
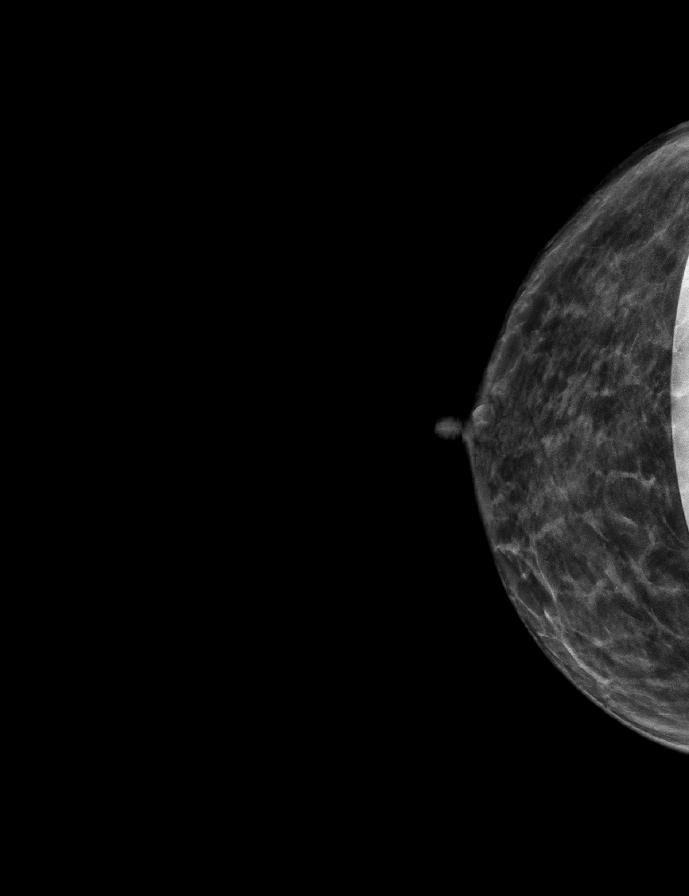

[L MLO synth-2D]
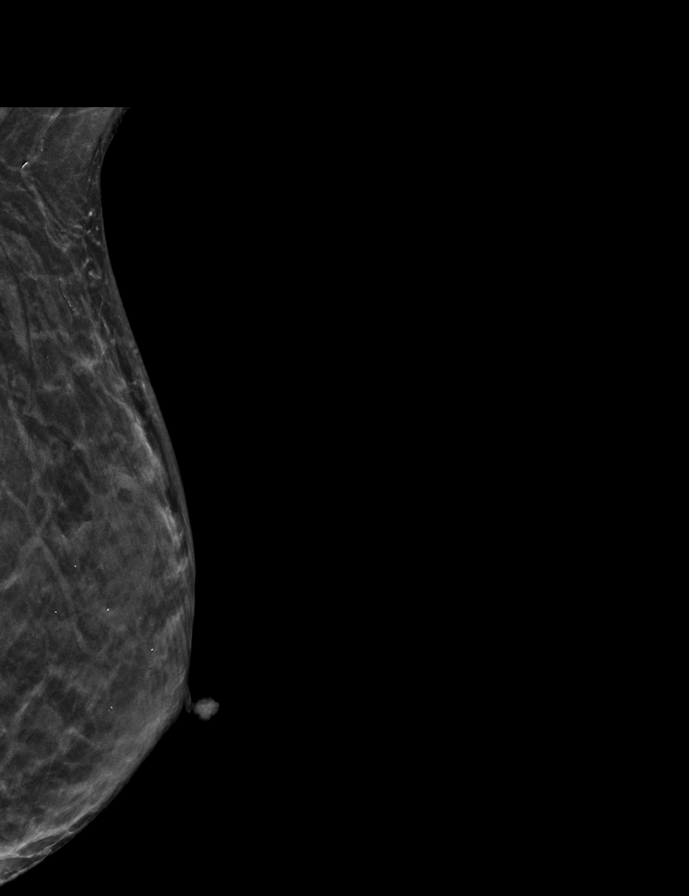

[L MLOID BREAST TOMOSYNTHESIS IMAGE tomo · tomo slice 23/46.0]
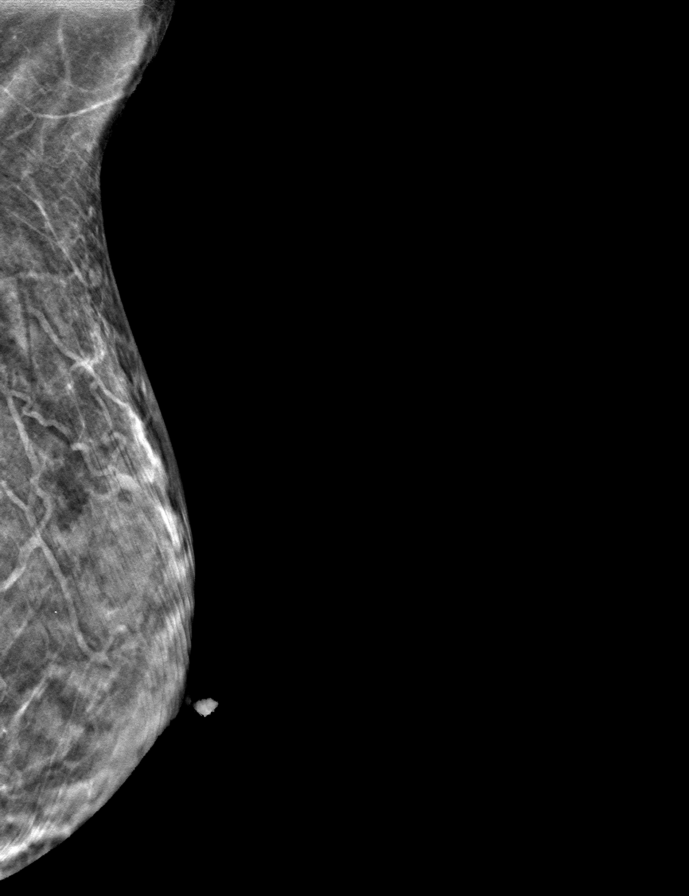

[9 of 28 positions shown; findings below may reference images not displayed]

ACR Breast Density Category d: The breast tissue is extremely dense,
which lowers the sensitivity of mammography.
FINDINGS: The patient has implants. There are no findings suspicious for
malignancy.
IMPRESSION: No mammographic evidence of malignancy. A result letter of this
screening mammogram will be mailed directly to the patient.

RECOMMENDATION:
Screening mammogram in one year. (Code:BC-Z-LU1)

BI-RADS CATEGORY  1:  Negative.

## 2024-03-14 ENCOUNTER — Other Ambulatory Visit: Payer: Self-pay | Admitting: Family Medicine

## 2024-03-14 DIAGNOSIS — Z1231 Encounter for screening mammogram for malignant neoplasm of breast: Secondary | ICD-10-CM

## 2024-03-18 ENCOUNTER — Ambulatory Visit
Admission: RE | Admit: 2024-03-18 | Discharge: 2024-03-18 | Disposition: A | Discharge status: Home / Self Care | Source: Ambulatory Visit | Attending: Family Medicine | Admitting: Family Medicine

## 2024-03-18 DIAGNOSIS — Z1231 Encounter for screening mammogram for malignant neoplasm of breast: Secondary | ICD-10-CM
